# Patient Record
Sex: Female | Born: 1950 | Race: Black or African American | Hispanic: No | Marital: Married | Smoking: Never smoker
Health system: Southern US, Community
[De-identification: ages and names within clinical notes are randomized; demographics above are authoritative.]

## PROBLEM LIST (undated history)

## (undated) DIAGNOSIS — I10 Essential (primary) hypertension: Secondary | ICD-10-CM

## (undated) DIAGNOSIS — M810 Age-related osteoporosis without current pathological fracture: Secondary | ICD-10-CM

## (undated) DIAGNOSIS — K219 Gastro-esophageal reflux disease without esophagitis: Secondary | ICD-10-CM

## (undated) DIAGNOSIS — E119 Type 2 diabetes mellitus without complications: Secondary | ICD-10-CM

## (undated) DIAGNOSIS — C801 Malignant (primary) neoplasm, unspecified: Secondary | ICD-10-CM

## (undated) DIAGNOSIS — M1990 Unspecified osteoarthritis, unspecified site: Secondary | ICD-10-CM

## (undated) HISTORY — DX: Age-related osteoporosis without current pathological fracture: M81.0

## (undated) HISTORY — PX: GASTRIC BYPASS: SHX52

---

## 2007-07-03 DIAGNOSIS — C801 Malignant (primary) neoplasm, unspecified: Secondary | ICD-10-CM

## 2007-07-03 HISTORY — PX: NASAL SINUS SURGERY: SHX719

## 2007-07-03 HISTORY — DX: Malignant (primary) neoplasm, unspecified: C80.1

## 2008-06-24 ENCOUNTER — Ambulatory Visit: Payer: Self-pay | Admitting: Otolaryngology

## 2008-06-24 ENCOUNTER — Ambulatory Visit: Payer: Self-pay | Admitting: Internal Medicine

## 2008-07-01 ENCOUNTER — Ambulatory Visit: Payer: Self-pay | Admitting: Otolaryngology

## 2008-07-02 ENCOUNTER — Ambulatory Visit: Payer: Self-pay | Admitting: Internal Medicine

## 2008-07-15 ENCOUNTER — Ambulatory Visit: Payer: Self-pay | Admitting: Internal Medicine

## 2008-07-22 ENCOUNTER — Ambulatory Visit: Payer: Self-pay | Admitting: Otolaryngology

## 2008-08-02 ENCOUNTER — Ambulatory Visit: Payer: Self-pay | Admitting: Internal Medicine

## 2008-08-05 ENCOUNTER — Ambulatory Visit: Payer: Self-pay | Admitting: Radiation Oncology

## 2008-08-30 ENCOUNTER — Ambulatory Visit: Payer: Self-pay | Admitting: Internal Medicine

## 2008-09-30 ENCOUNTER — Ambulatory Visit: Payer: Self-pay | Admitting: Internal Medicine

## 2008-10-30 ENCOUNTER — Ambulatory Visit: Payer: Self-pay | Admitting: Internal Medicine

## 2009-03-21 ENCOUNTER — Ambulatory Visit: Payer: Self-pay | Admitting: Internal Medicine

## 2009-04-01 ENCOUNTER — Ambulatory Visit: Payer: Self-pay | Admitting: Internal Medicine

## 2009-04-14 ENCOUNTER — Ambulatory Visit: Payer: Self-pay | Admitting: Otolaryngology

## 2009-04-21 ENCOUNTER — Ambulatory Visit: Payer: Self-pay | Admitting: Otolaryngology

## 2009-07-02 ENCOUNTER — Ambulatory Visit: Payer: Self-pay | Admitting: Internal Medicine

## 2009-07-22 ENCOUNTER — Ambulatory Visit: Payer: Self-pay | Admitting: Internal Medicine

## 2009-08-02 ENCOUNTER — Ambulatory Visit: Payer: Self-pay | Admitting: Internal Medicine

## 2009-09-30 ENCOUNTER — Ambulatory Visit: Payer: Self-pay | Admitting: Internal Medicine

## 2009-10-19 ENCOUNTER — Ambulatory Visit: Payer: Self-pay | Admitting: Internal Medicine

## 2009-10-30 ENCOUNTER — Ambulatory Visit: Payer: Self-pay | Admitting: Internal Medicine

## 2009-12-30 ENCOUNTER — Ambulatory Visit: Payer: Self-pay | Admitting: Internal Medicine

## 2010-01-20 ENCOUNTER — Ambulatory Visit: Payer: Self-pay | Admitting: Internal Medicine

## 2010-01-30 ENCOUNTER — Ambulatory Visit: Payer: Self-pay | Admitting: Internal Medicine

## 2010-07-24 ENCOUNTER — Ambulatory Visit: Payer: Self-pay | Admitting: Internal Medicine

## 2010-08-02 ENCOUNTER — Ambulatory Visit: Payer: Self-pay | Admitting: Internal Medicine

## 2010-10-15 IMAGING — CR DG CHEST 1V PORT
1 series · 1 of 1 positions shown · non-contrast
Comparison: none

REASON FOR EXAM: chest pain
COMMENTS:

[view not recorded]
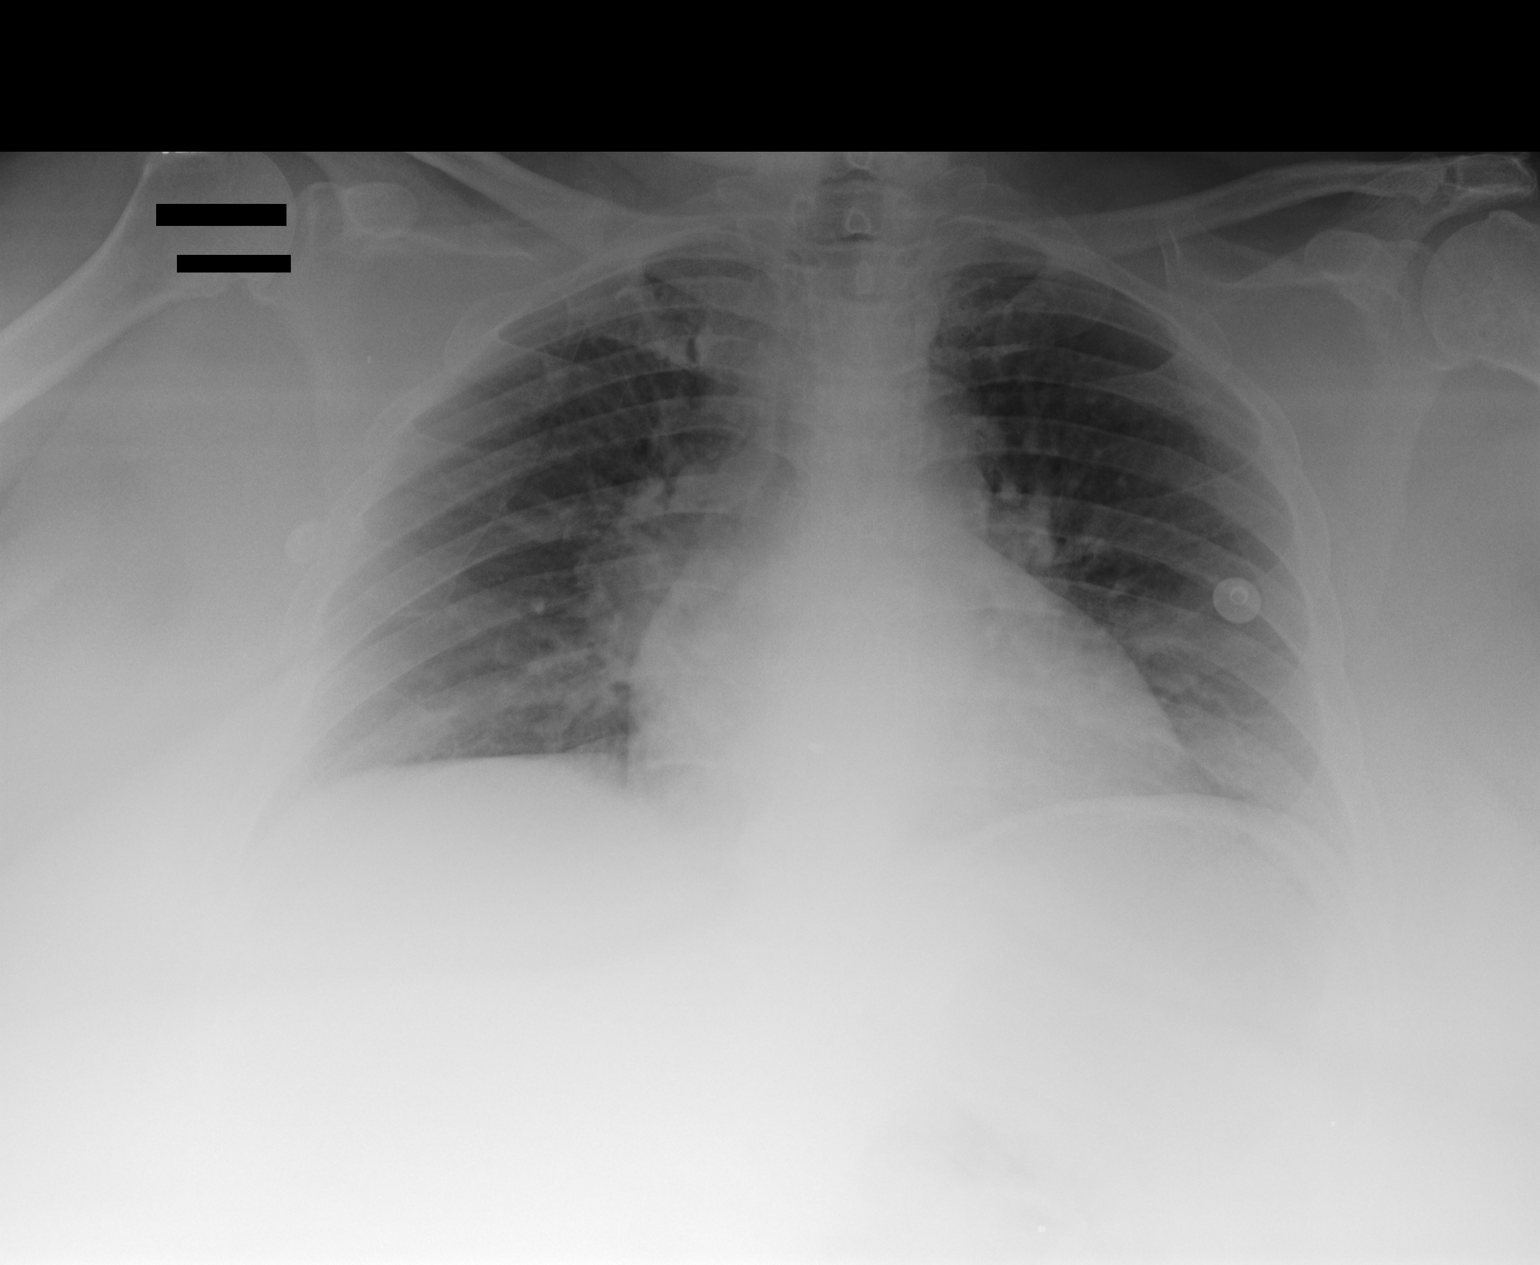

[1 of 1 positions shown; findings below may reference images not displayed]

PROCEDURE:     DXR - DXR PORTABLE CHEST SINGLE VIEW  - July 02, 2008  [DATE]

RESULT:      The patient has taken a shallow inspiration.  With technique
taken into consideration, there is no evidence of focal infiltrates,
effusions or edema. The cardiac silhouette is mildly enlarged. The
visualized bony skeleton is unremarkable.
IMPRESSION: Shallow inspiration without evidence of acute
cardiopulmonary disease.

## 2011-03-12 ENCOUNTER — Ambulatory Visit: Payer: Self-pay | Admitting: Internal Medicine

## 2011-04-02 ENCOUNTER — Ambulatory Visit: Payer: Self-pay | Admitting: Internal Medicine

## 2011-05-03 ENCOUNTER — Ambulatory Visit: Payer: Self-pay | Admitting: Internal Medicine

## 2016-01-09 LAB — VITAMIN B12: Vitamin B-12: >2000

## 2016-01-09 LAB — VITAMIN D 25 HYDROXY (VIT D DEFICIENCY, FRACTURES): Vit D, 25-Hydroxy: 52.4

## 2016-05-14 LAB — HM DEXA SCAN

## 2017-04-08 LAB — HM COLONOSCOPY

## 2017-06-03 ENCOUNTER — Other Ambulatory Visit: Payer: Self-pay

## 2017-06-12 NOTE — Discharge Instructions (Signed)

## 2017-06-17 ENCOUNTER — Encounter
Admission: RE | Disposition: A | Discharge status: Home / Self Care | Payer: Self-pay | Source: Ambulatory Visit | Attending: Ophthalmology

## 2017-06-17 ENCOUNTER — Ambulatory Visit
Admission: RE | Admit: 2017-06-17 | Discharge: 2017-06-17 | Disposition: A | Discharge status: Home / Self Care | Payer: BLUE CROSS/BLUE SHIELD | Source: Ambulatory Visit | Attending: Ophthalmology | Admitting: Ophthalmology

## 2017-06-17 ENCOUNTER — Ambulatory Visit: Payer: BLUE CROSS/BLUE SHIELD | Admitting: Anesthesiology

## 2017-06-17 DIAGNOSIS — M1990 Unspecified osteoarthritis, unspecified site: Secondary | ICD-10-CM | POA: Insufficient documentation

## 2017-06-17 DIAGNOSIS — K521 Toxic gastroenteritis and colitis: Secondary | ICD-10-CM | POA: Insufficient documentation

## 2017-06-17 DIAGNOSIS — Z6841 Body Mass Index (BMI) 40.0 and over, adult: Secondary | ICD-10-CM | POA: Insufficient documentation

## 2017-06-17 DIAGNOSIS — E1136 Type 2 diabetes mellitus with diabetic cataract: Secondary | ICD-10-CM | POA: Insufficient documentation

## 2017-06-17 HISTORY — PX: CATARACT EXTRACTION W/PHACO: SHX586

## 2017-06-17 HISTORY — DX: Malignant (primary) neoplasm, unspecified: C80.1

## 2017-06-17 HISTORY — DX: Gastro-esophageal reflux disease without esophagitis: K21.9

## 2017-06-17 HISTORY — DX: Type 2 diabetes mellitus without complications: E11.9

## 2017-06-17 HISTORY — DX: Essential (primary) hypertension: I10

## 2017-06-17 HISTORY — DX: Unspecified osteoarthritis, unspecified site: M19.90

## 2017-06-17 LAB — GLUCOSE, CAPILLARY
Glucose-Capillary: 128 mg/dL — ABNORMAL HIGH (ref 65–99)
Glucose-Capillary: 123 mg/dL — ABNORMAL HIGH (ref 65–99)

## 2017-06-17 SURGERY — PHACOEMULSIFICATION, CATARACT, WITH IOL INSERTION
Anesthesia: Monitor Anesthesia Care | Laterality: Left

## 2017-06-17 MED ORDER — LACTATED RINGERS IV SOLN: INTRAVENOUS | Status: DC

## 2017-06-17 MED ORDER — ACETAMINOPHEN 325 MG PO TABS
650.0000 mg | ORAL_TABLET | Freq: Four times a day (QID) | ORAL | Status: DC | PRN
  Administered 2017-06-17: 650 mg via ORAL

## 2017-06-17 MED ORDER — BRIMONIDINE TARTRATE-TIMOLOL 0.2-0.5 % OP SOLN
OPHTHALMIC | Status: DC | PRN
  Administered 2017-06-17: 1 [drp] via OPHTHALMIC

## 2017-06-17 MED ORDER — MIDAZOLAM HCL 2 MG/2ML IJ SOLN
INTRAMUSCULAR | Status: DC | PRN
  Administered 2017-06-17 (×2): 1 mg via INTRAVENOUS

## 2017-06-17 MED ORDER — LIDOCAINE HCL (PF) 2 % IJ SOLN
INTRAOCULAR | Status: DC | PRN
  Administered 2017-06-17: 1 mL via INTRAMUSCULAR

## 2017-06-17 MED ORDER — NA HYALUR & NA CHOND-NA HYALUR 0.4-0.35 ML IO KIT
PACK | INTRAOCULAR | Status: DC | PRN
  Administered 2017-06-17: 1 mL via INTRAOCULAR

## 2017-06-17 MED ORDER — CEFUROXIME OPHTHALMIC INJECTION 1 MG/0.1 ML
INJECTION | OPHTHALMIC | Status: DC | PRN
  Administered 2017-06-17: 0.1 mL via INTRACAMERAL

## 2017-06-17 MED ORDER — OXYCODONE HCL 5 MG PO TABS: 5.0000 mg | ORAL_TABLET | Freq: Once | ORAL | Status: DC | PRN

## 2017-06-17 MED ORDER — OXYCODONE HCL 5 MG/5ML PO SOLN: 5.0000 mg | Freq: Once | ORAL | Status: DC | PRN

## 2017-06-17 MED ORDER — FENTANYL CITRATE (PF) 100 MCG/2ML IJ SOLN
INTRAMUSCULAR | Status: DC | PRN
  Administered 2017-06-17: 50 ug via INTRAVENOUS

## 2017-06-17 MED ORDER — LACTATED RINGERS IV SOLN: 500.0000 mL | INTRAVENOUS | Status: DC

## 2017-06-17 MED ORDER — ARMC OPHTHALMIC DILATING DROPS
1.0000 "application " | OPHTHALMIC | Status: DC | PRN
  Administered 2017-06-17 (×3): 1 via OPHTHALMIC

## 2017-06-17 MED ORDER — MOXIFLOXACIN HCL 0.5 % OP SOLN
1.0000 [drp] | OPHTHALMIC | Status: DC | PRN
  Administered 2017-06-17 (×3): 1 [drp] via OPHTHALMIC

## 2017-06-17 SURGICAL SUPPLY — 25 items
CANNULA ANT/CHMB 27GA (MISCELLANEOUS) ×3 IMPLANT
CARTRIDGE ABBOTT (MISCELLANEOUS) IMPLANT
GLOVE SURG LX 7.5 STRW (GLOVE) ×2
GLOVE SURG LX STRL 7.5 STRW (GLOVE) ×1 IMPLANT
GLOVE SURG TRIUMPH 8.0 PF LTX (GLOVE) ×3 IMPLANT
GOWN STRL REUS W/ TWL LRG LVL3 (GOWN DISPOSABLE) ×2 IMPLANT
GOWN STRL REUS W/TWL LRG LVL3 (GOWN DISPOSABLE) ×4
LENS IOL TECNIS ITEC 20.5 (Intraocular Lens) ×3 IMPLANT
MARKER SKIN DUAL TIP RULER LAB (MISCELLANEOUS) ×3 IMPLANT
NDL RETROBULBAR .5 NSTRL (NEEDLE) IMPLANT
NEEDLE FILTER BLUNT 18X 1/2SAF (NEEDLE) ×2
NEEDLE FILTER BLUNT 18X1 1/2 (NEEDLE) ×1 IMPLANT
PACK CATARACT BRASINGTON (MISCELLANEOUS) ×3 IMPLANT
PACK EYE AFTER SURG (MISCELLANEOUS) ×3 IMPLANT
PACK OPTHALMIC (MISCELLANEOUS) ×3 IMPLANT
RING MALYGIN 7.0 (MISCELLANEOUS) IMPLANT
SUT ETHILON 10-0 CS-B-6CS-B-6 (SUTURE)
SUT VICRYL  9 0 (SUTURE)
SUT VICRYL 9 0 (SUTURE) IMPLANT
SUTURE EHLN 10-0 CS-B-6CS-B-6 (SUTURE) IMPLANT
SYR 3ML LL SCALE MARK (SYRINGE) ×3 IMPLANT
SYR 5ML LL (SYRINGE) ×3 IMPLANT
SYR TB 1ML LUER SLIP (SYRINGE) ×3 IMPLANT
WATER STERILE IRR 250ML POUR (IV SOLUTION) ×3 IMPLANT
WIPE NON LINTING 3.25X3.25 (MISCELLANEOUS) ×3 IMPLANT

## 2017-06-17 NOTE — H&P (Signed)
The History and Physical notes are on paper, have been signed, and are to be scanned. The patient remains stable and unchanged from the H&P.   Previous H&P reviewed, patient examined, and there are no changes.  Tina Carey 06/17/2017 8:49 AM

## 2017-06-17 NOTE — Anesthesia Preprocedure Evaluation (Signed)
Anesthesia Evaluation  Patient identified by MRN, date of birth, ID band  Reviewed: NPO status   History of Anesthesia Complications Negative for: history of anesthetic complications  Airway Mallampati: II  TM Distance: >3 FB Neck ROM: full    Dental no notable dental hx.    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    Anesthesia Other Findings   Reproductive/Obstetrics                             Anesthesia Physical Anesthesia Plan  ASA: III  Anesthesia Plan: MAC   Post-op Pain Management:    Induction:   PONV Risk Score and Plan:   Airway Management Planned:   Additional Equipment:   Intra-op Plan:   Post-operative Plan:   Informed Consent: I have reviewed the patients History and Physical, chart, labs and discussed the procedure including the risks, benefits and alternatives for the proposed anesthesia with the patient or authorized representative who has indicated his/her understanding and acceptance.     Plan Discussed with: CRNA  Anesthesia Plan Comments:         Anesthesia Quick Evaluation

## 2017-06-17 NOTE — Anesthesia Postprocedure Evaluation (Signed)
Anesthesia Post Note  Patient: Tina Carey  Procedure(s) Performed: CATARACT EXTRACTION PHACO AND INTRAOCULAR LENS PLACEMENT (IOC)  LEFT DIABETIC (Left )  Patient location during evaluation: PACU Anesthesia Type: MAC Level of consciousness: awake and alert Pain management: pain level controlled Vital Signs Assessment: post-procedure vital signs reviewed and stable Respiratory status: spontaneous breathing, nonlabored ventilation, respiratory function stable and patient connected to nasal cannula oxygen Cardiovascular status: stable and blood pressure returned to baseline Postop Assessment: no apparent nausea or vomiting Anesthetic complications: no    Tahjay Binion

## 2017-06-17 NOTE — Anesthesia Procedure Notes (Signed)
Procedure Name: MAC Date/Time: 06/17/2017 9:25 AM Performed by: Cameron Ali, CRNA Pre-anesthesia Checklist: Patient identified, Emergency Drugs available, Suction available, Timeout performed and Patient being monitored Patient Re-evaluated:Patient Re-evaluated prior to induction Oxygen Delivery Method: Nasal cannula Placement Confirmation: positive ETCO2

## 2017-06-17 NOTE — Op Note (Signed)
OPERATIVE NOTE  Tina Carey [id number] 06/17/2017   PREOPERATIVE DIAGNOSIS:  Nuclear sclerotic cataract left eye. H25.12   POSTOPERATIVE DIAGNOSIS:    Nuclear sclerotic cataract left eye.     PROCEDURE:  Phacoemusification with posterior chamber intraocular lens placement of the left eye   LENS:   Implant Name Type Inv. Item Serial No. Manufacturer Lot No. LRB No. Used  LENS IOL DIOP 20.5 - [id number] Intraocular Lens LENS IOL DIOP 20.5 [id number] AMO  Left 1        ULTRASOUND TIME: 14  % of 1 minutes 6 seconds, CDE 9.0  SURGEON:  Wyonia Hough, MD   ANESTHESIA:  Topical with tetracaine drops and 2% Xylocaine jelly, augmented with 1% preservative-free intracameral lidocaine.    COMPLICATIONS:  None.   DESCRIPTION OF PROCEDURE:  The patient was identified in the holding room and transported to the operating room and placed in the supine position under the operating microscope.  The left eye was identified as the operative eye and it was prepped and draped in the usual sterile ophthalmic fashion.   A 1 millimeter clear-corneal paracentesis was made at the 1:30 position.  0.5 ml of preservative-free 1% lidocaine was injected into the anterior chamber.  The anterior chamber was filled with Viscoat viscoelastic.  A 2.4 millimeter keratome was used to make a near-clear corneal incision at the 10:30 position.  .  A curvilinear capsulorrhexis was made with a cystotome and capsulorrhexis forceps.  Balanced salt solution was used to hydrodissect and hydrodelineate the nucleus.   Phacoemulsification was then used in stop and chop fashion to remove the lens nucleus and epinucleus.  The remaining cortex was then removed using the irrigation and aspiration handpiece. Provisc was then placed into the capsular bag to distend it for lens placement.  A lens was then injected into the capsular bag.  The remaining viscoelastic was aspirated.   Wounds were hydrated with balanced salt  solution.  The anterior chamber was inflated to a physiologic pressure with balanced salt solution.  No wound leaks were noted. Cefuroxime 0.1 ml of a 10mg /ml solution was injected into the anterior chamber for a dose of 1 mg of intracameral antibiotic at the completion of the case.   Timolol and Brimonidine drops were applied to the eye.  The patient was taken to the recovery room in stable condition without complications of anesthesia or surgery.  Tina Carey 06/17/2017, 9:43 AM

## 2017-06-17 NOTE — Transfer of Care (Signed)
Immediate Anesthesia Transfer of Care Note  Patient: Tina Carey  Procedure(s) Performed: CATARACT EXTRACTION PHACO AND INTRAOCULAR LENS PLACEMENT (IOC)  LEFT DIABETIC (Left )  Patient Location: PACU  Anesthesia Type: MAC  Level of Consciousness: awake, alert  and patient cooperative  Airway and Oxygen Therapy: Patient Spontanous Breathing and Patient connected to supplemental oxygen  Post-op Assessment: Post-op Vital signs reviewed, Patient's Cardiovascular Status Stable, Respiratory Function Stable, Patent Airway and No signs of Nausea or vomiting  Post-op Vital Signs: Reviewed and stable  Complications: No apparent anesthesia complications

## 2017-06-18 ENCOUNTER — Encounter: Payer: Self-pay | Admitting: Ophthalmology

## 2017-06-18 MED ORDER — EPINEPHRINE PF 1 MG/ML IJ SOLN
INTRAMUSCULAR | Status: DC | PRN
  Administered 2017-06-18: 70 mL via OPHTHALMIC

## 2017-10-16 ENCOUNTER — Encounter: Payer: Self-pay | Admitting: *Deleted

## 2017-10-16 ENCOUNTER — Other Ambulatory Visit: Payer: Self-pay

## 2017-10-21 NOTE — Discharge Instructions (Signed)

## 2017-10-23 ENCOUNTER — Ambulatory Visit
Admission: RE | Admit: 2017-10-23 | Discharge: 2017-10-23 | Disposition: A | Discharge status: Home / Self Care | Payer: Medicare Other | Source: Ambulatory Visit | Attending: Ophthalmology | Admitting: Ophthalmology

## 2017-10-23 ENCOUNTER — Encounter
Admission: RE | Disposition: A | Discharge status: Home / Self Care | Payer: Self-pay | Source: Ambulatory Visit | Attending: Ophthalmology

## 2017-10-23 ENCOUNTER — Ambulatory Visit: Payer: Medicare Other | Admitting: Anesthesiology

## 2017-10-23 DIAGNOSIS — Z7984 Long term (current) use of oral hypoglycemic drugs: Secondary | ICD-10-CM | POA: Diagnosis not present

## 2017-10-23 DIAGNOSIS — Z923 Personal history of irradiation: Secondary | ICD-10-CM | POA: Diagnosis not present

## 2017-10-23 DIAGNOSIS — Z6841 Body Mass Index (BMI) 40.0 and over, adult: Secondary | ICD-10-CM | POA: Insufficient documentation

## 2017-10-23 DIAGNOSIS — Z79899 Other long term (current) drug therapy: Secondary | ICD-10-CM | POA: Insufficient documentation

## 2017-10-23 DIAGNOSIS — K219 Gastro-esophageal reflux disease without esophagitis: Secondary | ICD-10-CM | POA: Diagnosis not present

## 2017-10-23 DIAGNOSIS — Z9221 Personal history of antineoplastic chemotherapy: Secondary | ICD-10-CM | POA: Insufficient documentation

## 2017-10-23 DIAGNOSIS — Z9842 Cataract extraction status, left eye: Secondary | ICD-10-CM | POA: Insufficient documentation

## 2017-10-23 DIAGNOSIS — Z9884 Bariatric surgery status: Secondary | ICD-10-CM | POA: Insufficient documentation

## 2017-10-23 DIAGNOSIS — H2511 Age-related nuclear cataract, right eye: Secondary | ICD-10-CM | POA: Insufficient documentation

## 2017-10-23 DIAGNOSIS — E1136 Type 2 diabetes mellitus with diabetic cataract: Secondary | ICD-10-CM | POA: Insufficient documentation

## 2017-10-23 DIAGNOSIS — Z87891 Personal history of nicotine dependence: Secondary | ICD-10-CM | POA: Insufficient documentation

## 2017-10-23 DIAGNOSIS — I10 Essential (primary) hypertension: Secondary | ICD-10-CM | POA: Diagnosis not present

## 2017-10-23 DIAGNOSIS — Z8522 Personal history of malignant neoplasm of nasal cavities, middle ear, and accessory sinuses: Secondary | ICD-10-CM | POA: Insufficient documentation

## 2017-10-23 HISTORY — PX: CATARACT EXTRACTION W/PHACO: SHX586

## 2017-10-23 LAB — GLUCOSE, CAPILLARY
GLUCOSE-CAPILLARY: 107 mg/dL — AB (ref 65–99)
Glucose-Capillary: 123 mg/dL — ABNORMAL HIGH (ref 65–99)

## 2017-10-23 SURGERY — PHACOEMULSIFICATION, CATARACT, WITH IOL INSERTION
Anesthesia: Monitor Anesthesia Care | Site: Eye | Laterality: Right | Wound class: "Clean "

## 2017-10-23 MED ORDER — MIDAZOLAM HCL 2 MG/2ML IJ SOLN
INTRAMUSCULAR | Status: DC | PRN
  Administered 2017-10-23: 1 mg via INTRAVENOUS

## 2017-10-23 MED ORDER — NA HYALUR & NA CHOND-NA HYALUR 0.4-0.35 ML IO KIT
PACK | INTRAOCULAR | Status: DC | PRN
  Administered 2017-10-23: 1 mL via INTRAOCULAR

## 2017-10-23 MED ORDER — ACETAMINOPHEN 325 MG PO TABS: 325.0000 mg | ORAL_TABLET | Freq: Once | ORAL | Status: DC

## 2017-10-23 MED ORDER — ARMC OPHTHALMIC DILATING DROPS
1.0000 "application " | OPHTHALMIC | Status: DC | PRN
  Administered 2017-10-23 (×3): 1 via OPHTHALMIC

## 2017-10-23 MED ORDER — FENTANYL CITRATE (PF) 100 MCG/2ML IJ SOLN
INTRAMUSCULAR | Status: DC | PRN
  Administered 2017-10-23: 50 ug via INTRAVENOUS

## 2017-10-23 MED ORDER — BRIMONIDINE TARTRATE-TIMOLOL 0.2-0.5 % OP SOLN
OPHTHALMIC | Status: DC | PRN
  Administered 2017-10-23: 1 [drp] via OPHTHALMIC

## 2017-10-23 MED ORDER — EPINEPHRINE PF 1 MG/ML IJ SOLN
INTRAOCULAR | Status: DC | PRN
  Administered 2017-10-23: 49 mL via OPHTHALMIC

## 2017-10-23 MED ORDER — ACETAMINOPHEN 160 MG/5ML PO SOLN: 325.0000 mg | Freq: Once | ORAL | Status: DC

## 2017-10-23 MED ORDER — MOXIFLOXACIN HCL 0.5 % OP SOLN
1.0000 [drp] | OPHTHALMIC | Status: DC | PRN
  Administered 2017-10-23 (×3): 1 [drp] via OPHTHALMIC

## 2017-10-23 MED ORDER — LACTATED RINGERS IV SOLN: 10.0000 mL/h | INTRAVENOUS | Status: DC

## 2017-10-23 MED ORDER — LIDOCAINE HCL (PF) 2 % IJ SOLN
INTRAOCULAR | Status: DC | PRN
  Administered 2017-10-23: .5 mL

## 2017-10-23 MED ORDER — CEFUROXIME OPHTHALMIC INJECTION 1 MG/0.1 ML
INJECTION | OPHTHALMIC | Status: DC | PRN
  Administered 2017-10-23: 0.1 mL via INTRACAMERAL

## 2017-10-23 SURGICAL SUPPLY — 27 items
CANNULA ANT/CHMB 27G (MISCELLANEOUS) ×1 IMPLANT
CANNULA ANT/CHMB 27GA (MISCELLANEOUS) ×3 IMPLANT
CARTRIDGE ABBOTT (MISCELLANEOUS) IMPLANT
GLOVE SURG LX 7.5 STRW (GLOVE) ×2
GLOVE SURG LX STRL 7.5 STRW (GLOVE) ×1 IMPLANT
GLOVE SURG TRIUMPH 8.0 PF LTX (GLOVE) ×3 IMPLANT
GOWN STRL REUS W/ TWL LRG LVL3 (GOWN DISPOSABLE) ×2 IMPLANT
GOWN STRL REUS W/TWL LRG LVL3 (GOWN DISPOSABLE) ×4
LENS IOL TECNIS ITEC 20.5 (Intraocular Lens) ×2 IMPLANT
MARKER SKIN DUAL TIP RULER LAB (MISCELLANEOUS) ×3 IMPLANT
NDL FILTER BLUNT 18X1 1/2 (NEEDLE) ×1 IMPLANT
NDL RETROBULBAR .5 NSTRL (NEEDLE) IMPLANT
NEEDLE FILTER BLUNT 18X 1/2SAF (NEEDLE) ×2
NEEDLE FILTER BLUNT 18X1 1/2 (NEEDLE) ×1 IMPLANT
PACK CATARACT BRASINGTON (MISCELLANEOUS) ×3 IMPLANT
PACK EYE AFTER SURG (MISCELLANEOUS) ×3 IMPLANT
PACK OPTHALMIC (MISCELLANEOUS) ×3 IMPLANT
RING MALYGIN 7.0 (MISCELLANEOUS) IMPLANT
SUT ETHILON 10-0 CS-B-6CS-B-6 (SUTURE)
SUT VICRYL  9 0 (SUTURE)
SUT VICRYL 9 0 (SUTURE) IMPLANT
SUTURE EHLN 10-0 CS-B-6CS-B-6 (SUTURE) IMPLANT
SYR 3ML LL SCALE MARK (SYRINGE) ×3 IMPLANT
SYR 5ML LL (SYRINGE) ×3 IMPLANT
SYR TB 1ML LUER SLIP (SYRINGE) ×3 IMPLANT
WATER STERILE IRR 500ML POUR (IV SOLUTION) ×3 IMPLANT
WIPE NON LINTING 3.25X3.25 (MISCELLANEOUS) ×3 IMPLANT

## 2017-10-23 NOTE — Op Note (Signed)
LOCATION:  [place]   PREOPERATIVE DIAGNOSIS:    Nuclear sclerotic cataract right eye. H25.11   POSTOPERATIVE DIAGNOSIS:  Nuclear sclerotic cataract right eye.     PROCEDURE:  Phacoemusification with posterior chamber intraocular lens placement of the right eye   LENS:   Implant Name Type Inv. Item Serial No. Manufacturer Lot No. LRB No. Used  LENS IOL DIOP 20.5 - [id number] Intraocular Lens LENS IOL DIOP 20.5 [id number] AMO  Right 1        ULTRASOUND TIME: 13 % of 0 minutes, 44 seconds.  CDE 5.78   SURGEON:  Wyonia Hough, MD   ANESTHESIA:  Topical with tetracaine drops and 2% Xylocaine jelly, augmented with 1% preservative-free intracameral lidocaine.    COMPLICATIONS:  None.   DESCRIPTION OF PROCEDURE:  The patient was identified in the holding room and transported to the operating room and placed in the supine position under the operating microscope.  The right eye was identified as the operative eye and it was prepped and draped in the usual sterile ophthalmic fashion.   A 1 millimeter clear-corneal paracentesis was made at the 12:00 position.  0.5 ml of preservative-free 1% lidocaine was injected into the anterior chamber. The anterior chamber was filled with Viscoat viscoelastic.  A 2.4 millimeter keratome was used to make a near-clear corneal incision at the 9:00 position.  A curvilinear capsulorrhexis was made with a cystotome and capsulorrhexis forceps.  Balanced salt solution was used to hydrodissect and hydrodelineate the nucleus.   Phacoemulsification was then used in stop and chop fashion to remove the lens nucleus and epinucleus.  The remaining cortex was then removed using the irrigation and aspiration handpiece. Provisc was then placed into the capsular bag to distend it for lens placement.  A lens was then injected into the capsular bag.  The remaining viscoelastic was aspirated.   Wounds were hydrated with balanced salt solution.  The anterior  chamber was inflated to a physiologic pressure with balanced salt solution.  No wound leaks were noted. Cefuroxime 0.1 ml of a 10mg /ml solution was injected into the anterior chamber for a dose of 1 mg of intracameral antibiotic at the completion of the case.   Timolol and Brimonidine drops were applied to the eye.  The patient was taken to the recovery room in stable condition without complications of anesthesia or surgery.   Tina Carey 10/23/2017, 7:59 AM

## 2017-10-23 NOTE — Anesthesia Procedure Notes (Signed)
Procedure Name: MAC Performed by: Lind Guest, CRNA Pre-anesthesia Checklist: Patient identified, Emergency Drugs available, Suction available, Patient being monitored and Timeout performed Patient Re-evaluated:Patient Re-evaluated prior to induction Oxygen Delivery Method: Nasal cannula

## 2017-10-23 NOTE — Anesthesia Postprocedure Evaluation (Signed)
Anesthesia Post Note  Patient: Tina Carey  Procedure(s) Performed: CATARACT EXTRACTION PHACO AND INTRAOCULAR LENS PLACEMENT (IOC) RIGHT DIABETIC (Right Eye)  Patient location during evaluation: PACU Anesthesia Type: MAC Level of consciousness: awake and alert and oriented Pain management: satisfactory to patient Vital Signs Assessment: post-procedure vital signs reviewed and stable Respiratory status: spontaneous breathing, nonlabored ventilation and respiratory function stable Cardiovascular status: blood pressure returned to baseline and stable Postop Assessment: Adequate PO intake and No signs of nausea or vomiting Anesthetic complications: no    Raliegh Ip

## 2017-10-23 NOTE — H&P (Signed)
The History and Physical notes are on paper, have been signed, and are to be scanned. The patient remains stable and unchanged from the H&P.   Previous H&P reviewed, patient examined, and there are no changes.  Tina Carey 10/23/2017 7:37 AM

## 2017-10-23 NOTE — Transfer of Care (Signed)
Immediate Anesthesia Transfer of Care Note  Patient: Tina Carey  Procedure(s) Performed: CATARACT EXTRACTION PHACO AND INTRAOCULAR LENS PLACEMENT (IOC) RIGHT DIABETIC (Right Eye)  Patient Location: PACU  Anesthesia Type: MAC  Level of Consciousness: awake, alert  and patient cooperative  Airway and Oxygen Therapy: Patient Spontanous Breathing and Patient connected to supplemental oxygen  Post-op Assessment: Post-op Vital signs reviewed, Patient's Cardiovascular Status Stable, Respiratory Function Stable, Patent Airway and No signs of Nausea or vomiting  Post-op Vital Signs: Reviewed and stable  Complications: No apparent anesthesia complications

## 2017-10-23 NOTE — Anesthesia Preprocedure Evaluation (Signed)
Anesthesia Evaluation  Patient identified by MRN, date of birth, ID band  Reviewed: NPO status   History of Anesthesia Complications Negative for: history of anesthetic complications  Airway Mallampati: II  TM Distance: >3 FB Neck ROM: full    Dental no notable dental hx.    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    Anesthesia Other Findings   Reproductive/Obstetrics                             Anesthesia Physical  Anesthesia Plan  ASA: III  Anesthesia Plan: MAC   Post-op Pain Management:    Induction:   PONV Risk Score and Plan: 2 and Treatment may vary due to age or medical condition and Ondansetron  Airway Management Planned:   Additional Equipment:   Intra-op Plan:   Post-operative Plan:   Informed Consent: I have reviewed the patients History and Physical, chart, labs and discussed the procedure including the risks, benefits and alternatives for the proposed anesthesia with the patient or authorized representative who has indicated his/her understanding and acceptance.     Plan Discussed with: CRNA  Anesthesia Plan Comments:         Anesthesia Quick Evaluation

## 2017-10-24 ENCOUNTER — Encounter: Payer: Self-pay | Admitting: Ophthalmology

## 2018-03-21 LAB — BASIC METABOLIC PANEL
BUN: 21 (ref 4–21)
CO2: 24 — AB (ref 13–22)
Chloride: 99 (ref 99–108)
Creatinine: 0.9 (ref 0.5–1.1)
Glucose: 117
Potassium: 4.5 (ref 3.4–5.3)
Sodium: 141 (ref 137–147)

## 2018-03-21 LAB — COMPREHENSIVE METABOLIC PANEL
Albumin: 4.6 (ref 3.5–5.0)
Calcium: 10.1 (ref 8.7–10.7)

## 2018-03-21 LAB — HEPATIC FUNCTION PANEL
ALT: 14 (ref 7–35)
AST: 18 (ref 13–35)
Alkaline Phosphatase: 73 (ref 25–125)
Bilirubin, Direct: 0.07 (ref 0.01–0.4)
Bilirubin, Total: 0.2

## 2018-03-21 LAB — CBC AND DIFFERENTIAL
HCT: 39 (ref 36–46)
Hemoglobin: 12.6 (ref 12.0–16.0)
Neutrophils Absolute: 6
Platelets: 428 — AB (ref 150–399)
WBC: 9.8

## 2018-03-21 LAB — CBC: RBC: 4.33 (ref 3.87–5.11)

## 2018-03-21 LAB — TSH: TSH: 0.84 (ref 0.41–5.90)

## 2018-03-21 LAB — LIPID PANEL
Cholesterol: 192 (ref 0–200)
HDL: 59 (ref 35–70)
LDL Cholesterol: 105
Triglycerides: 140 (ref 40–160)

## 2018-03-21 LAB — HEMOGLOBIN A1C: Hemoglobin A1C: 7.3

## 2018-07-08 LAB — BASIC METABOLIC PANEL
BUN: 17 (ref 4–21)
CO2: 22 (ref 13–22)
Chloride: 102 (ref 99–108)
Creatinine: 0.9 (ref 0.5–1.1)
Glucose: 173
Potassium: 5.1 (ref 3.4–5.3)
Sodium: 140 (ref 137–147)

## 2018-07-08 LAB — LIPID PANEL
Cholesterol: 199 (ref 0–200)
HDL: 63 (ref 35–70)
LDL Cholesterol: 113
Triglycerides: 116 (ref 40–160)

## 2018-07-08 LAB — HEMOGLOBIN A1C: Hemoglobin A1C: 7.9

## 2018-07-08 LAB — COMPREHENSIVE METABOLIC PANEL: Calcium: 9.9 (ref 8.7–10.7)

## 2018-09-19 LAB — COMPREHENSIVE METABOLIC PANEL
Albumin: 4.6 (ref 3.5–5.0)
Calcium: 10.1 (ref 8.7–10.7)

## 2018-09-19 LAB — HEMOGLOBIN A1C: Hemoglobin A1C: 6.5

## 2018-09-19 LAB — BASIC METABOLIC PANEL
BUN: 20 (ref 4–21)
CO2: 26 — AB (ref 13–22)
Chloride: 100 (ref 99–108)
Creatinine: 0.9 (ref 0.5–1.1)
Glucose: 95
Potassium: 4.8 (ref 3.4–5.3)
Sodium: 141 (ref 137–147)

## 2018-09-19 LAB — HEPATIC FUNCTION PANEL
ALT: 15 (ref 7–35)
AST: 19 (ref 13–35)
Bilirubin, Total: 0.3

## 2018-09-19 LAB — LIPID PANEL
Cholesterol: 182 (ref 0–200)
HDL: 61 (ref 35–70)
LDL Cholesterol: 104
Triglycerides: 83 (ref 40–160)

## 2019-05-20 LAB — HM DEXA SCAN

## 2019-05-20 LAB — HM MAMMOGRAPHY

## 2019-08-14 ENCOUNTER — Other Ambulatory Visit: Payer: Self-pay

## 2019-08-14 ENCOUNTER — Ambulatory Visit: Payer: Medicare HMO | Attending: Internal Medicine

## 2019-08-14 DIAGNOSIS — Z23 Encounter for immunization: Secondary | ICD-10-CM

## 2019-08-14 NOTE — Progress Notes (Signed)
   Covid-19 Vaccination Clinic  Name:  Tina Carey    MRN: [id number] DOB: [birth year]  08/14/2019  [name] was observed post Covid-19 immunization for 15 minutes without incidence. She was provided with Vaccine Information Sheet and instruction to access the V-Safe system.   [name] was instructed to call 911 with any severe reactions post vaccine: Marland Kitchen Difficulty breathing  . Swelling of your face and throat  . A fast heartbeat  . A bad rash all over your body  . Dizziness and weakness    Immunizations Administered    Name Date Dose VIS Date Route   Moderna COVID-19 Vaccine 08/14/2019 12:17 PM 0.5 mL 06/02/2019 Intramuscular   Manufacturer: Moderna   Lot: [id number]   [id number]

## 2019-08-24 DIAGNOSIS — M170 Bilateral primary osteoarthritis of knee: Secondary | ICD-10-CM | POA: Insufficient documentation

## 2019-09-14 ENCOUNTER — Ambulatory Visit: Payer: Medicare HMO | Attending: Internal Medicine

## 2019-09-14 DIAGNOSIS — Z23 Encounter for immunization: Secondary | ICD-10-CM

## 2019-09-14 NOTE — Progress Notes (Signed)
   Covid-19 Vaccination Clinic  Name:  Tina Carey    MRN: [id number] DOB: 07/09/1950  09/14/2019  Tina Carey was observed post Covid-19 immunization for 15 minutes without incident. She was provided with Vaccine Information Sheet and instruction to access the V-Safe system.   Tina Carey was instructed to call 911 with any severe reactions post vaccine: Marland Kitchen Difficulty breathing  . Swelling of face and throat  . A fast heartbeat  . A bad rash all over body  . Dizziness and weakness   Immunizations Administered    Name Date Dose VIS Date Route   Moderna COVID-19 Vaccine 09/14/2019 12:04 PM 0.5 mL 06/02/2019 Intramuscular   Manufacturer: Moderna   Lot: [id number]   [id number]

## 2020-02-02 ENCOUNTER — Ambulatory Visit: Payer: Self-pay | Admitting: Family Medicine

## 2020-02-10 ENCOUNTER — Encounter: Payer: Self-pay | Admitting: Family Medicine

## 2020-02-10 ENCOUNTER — Other Ambulatory Visit: Payer: Self-pay

## 2020-02-10 ENCOUNTER — Ambulatory Visit (INDEPENDENT_AMBULATORY_CARE_PROVIDER_SITE_OTHER): Payer: Medicare HMO | Admitting: Family Medicine

## 2020-02-10 VITALS — BP 132/46 | HR 78 | Temp 97.5°F | Resp 16 | Ht 60.0 in | Wt 252.8 lb

## 2020-02-10 DIAGNOSIS — K219 Gastro-esophageal reflux disease without esophagitis: Secondary | ICD-10-CM

## 2020-02-10 DIAGNOSIS — E1169 Type 2 diabetes mellitus with other specified complication: Secondary | ICD-10-CM | POA: Insufficient documentation

## 2020-02-10 DIAGNOSIS — Z8522 Personal history of malignant neoplasm of nasal cavities, middle ear, and accessory sinuses: Secondary | ICD-10-CM

## 2020-02-10 DIAGNOSIS — Z7689 Persons encountering health services in other specified circumstances: Secondary | ICD-10-CM | POA: Diagnosis not present

## 2020-02-10 DIAGNOSIS — E785 Hyperlipidemia, unspecified: Secondary | ICD-10-CM | POA: Insufficient documentation

## 2020-02-10 DIAGNOSIS — Z6841 Body Mass Index (BMI) 40.0 and over, adult: Secondary | ICD-10-CM | POA: Insufficient documentation

## 2020-02-10 MED ORDER — PANTOPRAZOLE SODIUM 40 MG PO TBEC: 40.0000 mg | DELAYED_RELEASE_TABLET | Freq: Every day | ORAL | 3 refills | Status: DC

## 2020-02-10 MED ORDER — GLIMEPIRIDE 2 MG PO TABS: 2.0000 mg | ORAL_TABLET | Freq: Two times a day (BID) | ORAL | 3 refills | Status: DC

## 2020-02-10 MED ORDER — METFORMIN HCL ER 500 MG PO TB24: ORAL_TABLET | ORAL | 3 refills | Status: DC

## 2020-02-10 MED ORDER — LISINOPRIL 10 MG PO TABS: 10.0000 mg | ORAL_TABLET | Freq: Every day | ORAL | 3 refills | Status: DC

## 2020-02-10 NOTE — Patient Instructions (Addendum)
Thank you for coming to the office today.  Refilled all meds to [facility] for 90 day with plenty of refills.  Call insurance find cost and coverage of the following   Rybelsus (pill - oral semaglutide or ozempic) - once daily, taken first thing in morning without other meds Ozempic (injection once week Trulicity (injection once week  We can get these newer meds at low cost if you are interested.  3 benefits - 1 significantly reduced A1c sugar, and may be able to reduce or stop metformin in future - 2 reduced appetite and weight loss with good results - 3 cardiovascular risk reduction, less likely to have heart attack/stroke   Next appointment fasting, in AM, will decide what blood to draw AFTER your appointment.  We will order the Mammogram / DEXA bone scan next time.  Please schedule a Follow-up Appointment to: Return in about 3 months (around 05/12/2020) for 3 month Diabetes, fasting lab AFTER apt..  If you have any other questions or concerns, please feel free to call the office or send a message through [place]. You may also schedule an earlier appointment if necessary.  Additionally, you may be receiving a survey about your experience at our office within a few days to 1 week by e-mail or mail. We value your feedback.  Nobie Putnam, DO [place]
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
## 2020-02-11 ENCOUNTER — Encounter: Payer: Self-pay | Admitting: Family Medicine

## 2020-02-11 DIAGNOSIS — Z8522 Personal history of malignant neoplasm of nasal cavities, middle ear, and accessory sinuses: Secondary | ICD-10-CM | POA: Insufficient documentation

## 2020-02-11 NOTE — Assessment & Plan Note (Signed)
Followed by Dr Delice Lesch ENT No recurrence, >10 years S/p surgical execision + chemoradiation

## 2020-02-11 NOTE — Assessment & Plan Note (Signed)
Encourage diet exercise lifestyle goals Future consider GLP1 therapy for wt loss

## 2020-02-11 NOTE — Assessment & Plan Note (Signed)
Previously reported Well-controlled DM with A1c approximately 6-7 Complications - hyperlipidemia, GERD,  morbid obesity, - increases risk of future cardiovascular complications   Plan:  1. Continue current therapy - Metformin XR 500mg  x 1 breakfast / 1 lunch / 2 supper, and Glimepiride 2mg  BID wc - Discussion today on counseling about benefits and advantage of newer diabetic therapy with SGLT2 and GLP1 therapy, she declines injections. She is interested in Rybelsus, she will check into this before next apt and see lab results 2. Encourage improved lifestyle - low carb, low sugar diet, reduce portion size, continue improving regular exercise 3. Check CBG , bring log to next visit for review 4. Continue ACEi 5. DM Foot exam done today / Advised to schedule DM ophtho exam, send record

## 2020-02-11 NOTE — Assessment & Plan Note (Signed)
Previously reported controlled cholesterol on lifestyle Last lipid panel uncertain 3-46 mo ago Elevated ASCVD based on age + Diabetes  Plan: 1. Offered statin therapy. She declines today due to concerns regarding side effects despite counseling on benefits. 2. Encourage improved lifestyle - low carb/cholesterol, reduce portion size, continue improving regular exercise 3. Follow-up w/ review of records, anticipate labs in 3 months regardless

## 2020-03-01 LAB — HM DIABETES EYE EXAM

## 2020-03-03 ENCOUNTER — Encounter: Payer: Self-pay | Admitting: Family Medicine

## 2020-03-04 ENCOUNTER — Encounter: Payer: Self-pay | Admitting: Family Medicine

## 2020-03-04 DIAGNOSIS — E113299 Type 2 diabetes mellitus with mild nonproliferative diabetic retinopathy without macular edema, unspecified eye: Secondary | ICD-10-CM | POA: Insufficient documentation

## 2020-03-10 ENCOUNTER — Encounter: Payer: Self-pay | Admitting: Family Medicine

## 2020-03-15 ENCOUNTER — Encounter: Payer: Self-pay | Admitting: Family Medicine

## 2020-05-17 ENCOUNTER — Other Ambulatory Visit: Payer: Self-pay

## 2020-05-17 ENCOUNTER — Ambulatory Visit (INDEPENDENT_AMBULATORY_CARE_PROVIDER_SITE_OTHER): Payer: Medicare HMO | Admitting: Family Medicine

## 2020-05-17 ENCOUNTER — Encounter: Payer: Self-pay | Admitting: Family Medicine

## 2020-05-17 VITALS — BP 138/49 | HR 78 | Temp 97.7°F | Resp 16 | Ht 60.0 in | Wt 252.0 lb

## 2020-05-17 DIAGNOSIS — E113299 Type 2 diabetes mellitus with mild nonproliferative diabetic retinopathy without macular edema, unspecified eye: Secondary | ICD-10-CM

## 2020-05-17 DIAGNOSIS — E1169 Type 2 diabetes mellitus with other specified complication: Secondary | ICD-10-CM | POA: Diagnosis not present

## 2020-05-17 DIAGNOSIS — E785 Hyperlipidemia, unspecified: Secondary | ICD-10-CM

## 2020-05-17 DIAGNOSIS — Z6841 Body Mass Index (BMI) 40.0 and over, adult: Secondary | ICD-10-CM

## 2020-05-17 DIAGNOSIS — Z1159 Encounter for screening for other viral diseases: Secondary | ICD-10-CM | POA: Diagnosis not present

## 2020-05-17 LAB — CBC WITH DIFFERENTIAL/PLATELET
Basophils Relative: 1.1 %
MPV: 10 fL (ref 7.5–12.5)

## 2020-05-17 LAB — POCT UA - MICROALBUMIN: Microalbumin Ur, POC: 0 mg/L

## 2020-05-17 NOTE — Patient Instructions (Addendum)
Thank you for coming to the office today.  Labs done today - stay tuned for results with A1c  Urine microalbumin protein test for checking kidney health in diabetes  Rybelsus   IF you start the new medication - STOP Glimepiride (Amaryl) 2mg  twice a day.  KEEP current Metformin as you are taking it now, no change until the future when on higher dose of Rybelsus  After 1st 30 days of Rybelsus 3mg  low dose, we will INCREASE it and order NEW PILL - for 7mg  then again in 3 months dose increase if need.  Call insurance find cost and coverage of the following   Rybelsus (pill - oral semaglutide or ozempic) - once daily, taken first thing in morning without other meds  3 benefits - 1 significantly reduced A1c sugar, and may be able to reduce or stop metformin in future - 2 reduced appetite and weight loss with good results - 3 cardiovascular risk reduction, less likely to have heart attack/stroke  ----------------------------------------  Stay tuned for call back on A1c lab result and we can order med if you are interested and if it is covered or affordable  1st rx 30 day only  Please schedule a Follow-up Appointment to: Return in about 3 months (around 08/17/2020) for 3 month DM A1c.  If you have any other questions or concerns, please feel free to call the office or send a message through [facility]. You may also schedule an earlier appointment if necessary.  Additionally, you may be receiving a survey about your experience at our office within a few days to 1 week by e-mail or mail. We value your feedback.  Nobie Putnam, DO [facility]

## 2020-05-17 NOTE — Assessment & Plan Note (Addendum)
Previously reported Well-controlled DM with A1c approximately 6-7 Complications - hyperlipidemia, GERD,  morbid obesity, - increases risk of future cardiovascular complications   Plan:  1. Continue current therapy - Metformin XR 500mg  x 1 breakfast / 1 lunch / 2 supper, and Glimepiride 2mg  BID wc - Discussion today on counseling about benefits and advantage of newer diabetic therapy with GLP1 therapy, she declines injections. She is interested in Rybelsus discussed dosing and regimen - she will check cost/cover and based on upcoming lab today A1c - we can contact her and order 30 day Rybelsus 3mg  daily BEFORE breakfast on empty stomach 30 min.  - if starts Rybelsus, she would KEEP Metformin dose for now and DISCONTINUE Glimepiride for now.  2. Encourage improved lifestyle - low carb, low sugar diet, reduce portion size, continue improving regular exercise 3. Check CBG , bring log to next visit for review 4. Continue ACEi  Check Urine Microalbumin today
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
## 2020-05-17 NOTE — Assessment & Plan Note (Addendum)
Previously reported controlled cholesterol on lifestyle Reviewed lab lipid from prior PCP Elevated ASCVD based on age + Diabetes  Plan: 1. Offered statin therapy. She declines today due to concerns regarding side effects despite counseling on benefits. - Will check fasting lipid and rediscuss statin in future 2. Encourage improved lifestyle - low carb/cholesterol, reduce portion size, continue improving regular exercise

## 2020-05-17 NOTE — Assessment & Plan Note (Signed)
Secondary to DM 

## 2020-05-18 LAB — COMPLETE METABOLIC PANEL WITH GFR
AG Ratio: 1.6 (calc) (ref 1.0–2.5)
ALT: 12 U/L (ref 6–29)
AST: 16 U/L (ref 10–35)
Albumin: 4.5 g/dL (ref 3.6–5.1)
Alkaline phosphatase (APISO): 57 U/L (ref 37–153)
BUN: 22 mg/dL (ref 7–25)
CO2: 26 mmol/L (ref 20–32)
Calcium: 10.3 mg/dL (ref 8.6–10.4)
Chloride: 102 mmol/L (ref 98–110)
Creat: 0.95 mg/dL (ref 0.50–0.99)
GFR, Est African American: 72 mL/min/{1.73_m2} (ref >=60)
GFR, Est Non African American: 62 mL/min/{1.73_m2} (ref >=60)
Globulin: 2.9 g/dL (calc) (ref 1.9–3.7)
Glucose, Bld: 103 mg/dL — ABNORMAL HIGH (ref 65–99)
Potassium: 4.2 mmol/L (ref 3.5–5.3)
Sodium: 141 mmol/L (ref 135–146)
Total Bilirubin: 0.3 mg/dL (ref 0.2–1.2)
Total Protein: 7.4 g/dL (ref 6.1–8.1)

## 2020-05-18 LAB — CBC WITH DIFFERENTIAL/PLATELET
Absolute Monocytes: 474 cells/uL (ref 200–950)
Basophils Absolute: 81 cells/uL (ref 0–200)
Eosinophils Absolute: 192 cells/uL (ref 15–500)
Eosinophils Relative: 2.6 %
HCT: 38.2 % (ref 35.0–45.0)
Hemoglobin: 12.6 g/dL (ref 11.7–15.5)
Lymphs Abs: 2753 cells/uL (ref 850–3900)
MCH: 29 pg (ref 27.0–33.0)
MCHC: 33 g/dL (ref 32.0–36.0)
MCV: 88 fL (ref 80.0–100.0)
Monocytes Relative: 6.4 %
Neutro Abs: 3900 cells/uL (ref 1500–7800)
Neutrophils Relative %: 52.7 %
Platelets: 447 10*3/uL — ABNORMAL HIGH (ref 140–400)
RBC: 4.34 10*6/uL (ref 3.80–5.10)
RDW: 13 % (ref 11.0–15.0)
Total Lymphocyte: 37.2 %
WBC: 7.4 10*3/uL (ref 3.8–10.8)

## 2020-05-18 LAB — LIPID PANEL
Cholesterol: 191 mg/dL (ref <200)
HDL: 60 mg/dL (ref >=50)
LDL Cholesterol (Calc): 108 mg/dL (calc) — ABNORMAL HIGH
Non-HDL Cholesterol (Calc): 131 mg/dL (calc) — ABNORMAL HIGH (ref <130)
Total CHOL/HDL Ratio: 3.2 (calc) (ref <5.0)
Triglycerides: 118 mg/dL (ref <150)

## 2020-05-18 LAB — HEPATITIS C ANTIBODY
Hepatitis C Ab: NONREACTIVE
SIGNAL TO CUT-OFF: 0.01 (ref <1.00)

## 2020-05-18 LAB — TSH: TSH: 0.65 mIU/L (ref 0.40–4.50)

## 2020-05-18 LAB — HEMOGLOBIN A1C
Hgb A1c MFr Bld: 6.5 % of total Hgb — ABNORMAL HIGH (ref <5.7)
Mean Plasma Glucose: 140 (calc)
eAG (mmol/L): 7.7 (calc)

## 2020-05-18 MED ORDER — RYBELSUS 3 MG PO TABS: 3.0000 mg | ORAL_TABLET | Freq: Every day | ORAL | 0 refills | Status: DC

## 2020-05-18 NOTE — Addendum Note (Signed)
Addended by: Olin Hauser on: 05/18/2020 12:24 PM   Modules accepted: Orders

## 2020-06-08 ENCOUNTER — Telehealth: Payer: Self-pay

## 2020-06-08 NOTE — Telephone Encounter (Signed)
Copied from [place] [phone]. Topic: General - Other >> Jun 06, 2020 10:11 AM Rainey Pines A wrote: Patient stated that she received notification from pharmacy that theey were in the process of pushing an order for her  glimepiride (AMARYL) 2 MG tablet  through. Patient stated that she needs PCP to [facility] and [facility] the prescription since she was advised by pcp to no loner take medication .Please advise

## 2020-06-08 NOTE — Telephone Encounter (Signed)
[facility] long hold unable to reach out to anyone and the med's is not active in patient's list.

## 2020-06-14 ENCOUNTER — Telehealth: Payer: Self-pay | Admitting: Family Medicine

## 2020-06-14 DIAGNOSIS — E1169 Type 2 diabetes mellitus with other specified complication: Secondary | ICD-10-CM

## 2020-06-14 MED ORDER — RYBELSUS 7 MG PO TABS: 7.0000 mg | ORAL_TABLET | Freq: Every day | ORAL | 2 refills | Status: DC

## 2020-06-14 NOTE — Telephone Encounter (Signed)
Patient informed. 

## 2020-06-14 NOTE — Telephone Encounter (Signed)
Please notify patient. New rx Rybelsus 7mg  ordered to her pharmacy. I did 30 pills +2 refills, if she prefers 90 day we can send that in future, let us know.  Nobie Putnam, [facility] 06/14/2020, 1:28 PM

## 2020-06-14 NOTE — Telephone Encounter (Signed)
Pt is taking rybelsus 3 mg and in 30 day she suppose to increase to 7 mg. Pt will run out of 3 mg on 06/22/2020 and would like new rx rybelsus 7 mg  to be sent to [facility] [address]

## 2020-07-08 ENCOUNTER — Telehealth: Payer: Self-pay

## 2020-07-08 NOTE — Telephone Encounter (Signed)
Patient was advised to inquire at the pharmacy she will call them tomorrow.

## 2020-07-08 NOTE — Telephone Encounter (Signed)
Copied from [place] [id number]. Topic: General - Other >> Jul 08, 2020  3:38 PM Leward Quan A wrote: Reason for CRM: Patient called in to inform Dr Raliegh Ip that she seen where there is a recall on Metformin 750 MG she takes the 500 MG but is concerned and need a call back to let her know if it is still safe for her to keep taking this medication. Please call  Ph# [phone]

## 2020-07-20 ENCOUNTER — Telehealth: Payer: Self-pay

## 2020-07-20 DIAGNOSIS — E1169 Type 2 diabetes mellitus with other specified complication: Secondary | ICD-10-CM

## 2020-07-20 MED ORDER — RYBELSUS 7 MG PO TABS: 7.0000 mg | ORAL_TABLET | Freq: Every day | ORAL | 1 refills | Status: DC

## 2020-07-20 NOTE — Telephone Encounter (Signed)
Called patient.  1 more month until next apt and A1c  We agree send Rx Rybelsus 7mg  [facility] now, adjust in future if needed  May reduce metformin and inc rybelsus  Nobie Putnam, [facility] 07/20/2020, 5:54 PM

## 2020-07-20 NOTE — Telephone Encounter (Signed)
Copied from [place] [id number]. Topic: Quick Communication - See Telephone Encounter >> Jul 19, 2020  9:04 AM Loma Boston wrote: CRM for notification. See Telephone encounter for: 07/19/20.RYBELSUS 7 MG TABS 30 tablet 2 06/14/2020   Sig - Route: Take 7 mg by mouth daily before breakfast. - Oral  Sent to pharmacy as: RYBELSUS 7 MG Tab  Pt has called in a stated Dr Raliegh Ip said he was going to try her at different MG on this med before ordering and establishing at [place].  She has had the 3 mg and now the 7 mg. She will soon be out and wanting it to be [facility] due to 90 day 1 [phone]

## 2020-07-28 ENCOUNTER — Telehealth: Payer: Self-pay | Admitting: Family Medicine

## 2020-07-28 ENCOUNTER — Telehealth: Payer: Self-pay

## 2020-07-28 NOTE — Telephone Encounter (Signed)
Copied from [place] [id number]. Topic: Quick Communication - See Telephone Encounter >> Jul 28, 2020 11:18 AM Keene Breath wrote: Patient would like to the nurse to call her regarding her medication which seems to be delayed [facility].  Patient would have been without her meds. For more than 5 days and is not sure of the side effects without taking her medication.  Please advise and call patient to discuss at [phone]

## 2020-07-28 NOTE — Telephone Encounter (Signed)
Please provide the patient reassurance that at it is okay to be off the medication for a week or more due to waiting on her new rx to arrive.   She does not need an interim medication.  I am not sure what the delay in receiving the medication is.  It was ordered and sent to [facility] on 07/20/20.  I am not sure if we need to contact [facility] to confirm that the rx is still on the way? Or if they told her when it is expected to deliver.  Nobie Putnam, [facility] 07/28/2020, 1:46 PM

## 2020-07-28 NOTE — Telephone Encounter (Signed)
Forwarded to [facility] by mistake pt has concerns about medication, thank you

## 2020-07-28 NOTE — Telephone Encounter (Signed)
I called and left a detail message on the patient vm that the prescription was sent over to [facility] on 07/20/20.

## 2020-07-28 NOTE — Telephone Encounter (Signed)
Patient would like to be contacted by PCP to discuss the potential effects of being unable to take her medication (Rybelsus 7mg ) if it is delayed by mail. Patient is seeking clarity as well as a solution in the event that a delay occurs. Patient would also like to inquire about alternatives as well as temporary prescriptions.

## 2020-08-22 ENCOUNTER — Ambulatory Visit (INDEPENDENT_AMBULATORY_CARE_PROVIDER_SITE_OTHER): Payer: Medicare HMO | Admitting: Family Medicine

## 2020-08-22 ENCOUNTER — Encounter: Payer: Self-pay | Admitting: Family Medicine

## 2020-08-22 ENCOUNTER — Other Ambulatory Visit: Payer: Self-pay

## 2020-08-22 VITALS — BP 140/60 | HR 86 | Ht 59.0 in | Wt 250.8 lb

## 2020-08-22 DIAGNOSIS — E1169 Type 2 diabetes mellitus with other specified complication: Secondary | ICD-10-CM | POA: Diagnosis not present

## 2020-08-22 DIAGNOSIS — Z1231 Encounter for screening mammogram for malignant neoplasm of breast: Secondary | ICD-10-CM

## 2020-08-22 NOTE — Assessment & Plan Note (Signed)
Initial POC A1c up to 8.1 unsure if accurate with home CBG readings consistently 120 range avg reported Previously reported Well-controlled DM with A1c approximately 6-7 Complications - hyperlipidemia, GERD,  morbid obesity, - increases risk of future cardiovascular complications   Plan:  1. RE-CHECK A1c today lab draw 2. For now - continue current therapy - Rybelsus 7mg  daily (empty stomach, she is taking it properly today on history) - Metformin XR 500mg  x 1 breakfast / 1 lunch / 2 supper  If A1c is >7.5 to 8% or higher - She can double up of the Rybelsus 7mg  x 2 = 14mg , and when ready soon I can order Rybelsus 14mg  daily, higher dose  If A1c is < 7.5% - we can keep current therapy Metformin + Rybelsus 7mg  daily and keep improving diet  2. Encourage improved lifestyle - low carb, low sugar diet, reduce portion size, continue improving regular exercise 3. Check CBG , bring log to next visit for review 4. Continue ACEi

## 2020-08-22 NOTE — Patient Instructions (Addendum)
Thank you for coming to the office today.  Keep checking sugar regularly.  We will check the lab draw sugar A1c test today Fingerstick was 8.1% but may not be accurate  If new lab check shows A1c >8% or still higher 7 range, we will DOUBLE RYbelsus 7mg , take TWO of them at one time back to back, just as you are, then let me know when ready and I can order the Rybelsus 14mg  dosage - highest dose. I can try to order 90 day if you prefer as well.  If new lab check is 6 range or low 7, perhaps then the first one was incorrect and we can KEEP current dose Rybelsus 7mg .  Keep reducing your sugar intake.  For Mammogram screening for breast cancer   Call the [facility] below anytime to schedule your own appointment now that order has been placed.  [facility] [address] [place], [place] [address] Phone: [phone]   Please schedule a Follow-up Appointment to: Return in about 3 months (around 11/19/2020) for 3 month DM A1c (can re-schedule or cancel the March 2022 visit).  If you have any other questions or concerns, please feel free to call the office or send a message through [place]. You may also schedule an earlier appointment if necessary.  Additionally, you may be receiving a survey about your experience at our office within a few days to 1 week by e-mail or mail. We value your feedback.  Nobie Putnam, DO [place]
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
## 2020-08-23 ENCOUNTER — Inpatient Hospital Stay
Admission: RE | Admit: 2020-08-23 | Discharge: 2020-08-23 | Disposition: A | Discharge status: Home / Self Care | Payer: Self-pay | Source: Ambulatory Visit | Attending: *Deleted | Admitting: *Deleted

## 2020-08-23 ENCOUNTER — Other Ambulatory Visit: Payer: Self-pay | Admitting: *Deleted

## 2020-08-23 DIAGNOSIS — Z1231 Encounter for screening mammogram for malignant neoplasm of breast: Secondary | ICD-10-CM

## 2020-08-23 LAB — HEMOGLOBIN A1C
Hgb A1c MFr Bld: 8.5 % of total Hgb — ABNORMAL HIGH (ref <5.7)
Mean Plasma Glucose: 197 mg/dL
eAG (mmol/L): 10.9 mmol/L

## 2020-08-31 LAB — HM DIABETES EYE EXAM

## 2020-09-06 ENCOUNTER — Ambulatory Visit: Payer: Medicare HMO | Admitting: Family Medicine

## 2020-09-15 ENCOUNTER — Ambulatory Visit
Admission: RE | Admit: 2020-09-15 | Discharge: 2020-09-15 | Disposition: A | Discharge status: Home / Self Care | Payer: Medicare HMO | Source: Ambulatory Visit | Attending: Family Medicine | Admitting: Family Medicine

## 2020-09-15 ENCOUNTER — Other Ambulatory Visit: Payer: Self-pay

## 2020-09-15 DIAGNOSIS — Z1231 Encounter for screening mammogram for malignant neoplasm of breast: Secondary | ICD-10-CM | POA: Diagnosis present

## 2020-10-14 ENCOUNTER — Other Ambulatory Visit: Payer: Self-pay | Admitting: Family Medicine

## 2020-10-14 DIAGNOSIS — E1169 Type 2 diabetes mellitus with other specified complication: Secondary | ICD-10-CM

## 2020-10-14 MED ORDER — RYBELSUS 7 MG PO TABS: 7.0000 mg | ORAL_TABLET | Freq: Every day | ORAL | 1 refills | Status: DC

## 2020-10-14 NOTE — Telephone Encounter (Signed)
Medication Refill - Medication:  90 day supply RYBELSUS 7 MG TABS   Has the patient contacted their pharmacy? Yes.    (Agent: If yes, when and what did the pharmacy advise?) Contact PCP office  Preferred Pharmacy (with phone number or street name):   [facility] - [address], [place] Phone:  [phone]  Fax:  [phone]      Agent: Please be advised that RX refills may take up to 3 business days. We ask that you follow-up with your pharmacy.

## 2020-10-14 NOTE — Telephone Encounter (Signed)
Requested medication (s) are due for refill today:yes  Requested medication (s) are on the active medication list: yes   Future visit scheduled: yes  Notes to clinic: Medication not assigned to a protocol, review manually.   Requested Prescriptions  Pending Prescriptions Disp Refills   RYBELSUS 7 MG TABS 90 tablet 1    Sig: Take 7 mg by mouth daily before breakfast.      Off-Protocol Failed - 10/14/2020 10:05 AM      Failed - Medication not assigned to a protocol, review manually.      Passed - Valid encounter within last 12 months    Recent Outpatient Visits           1 month ago Type 2 diabetes mellitus with other specified complication, without long-term current use of insulin ([place])   [facility], Devonne Doughty, DO   5 months ago Type 2 diabetes mellitus with other specified complication, without long-term current use of insulin [facility])   [place], DO   8 months ago Type 2 diabetes mellitus with other specified complication, without long-term current use of insulin ([place])   [facility] [place], Devonne Doughty, DO       Future Appointments             In 1 month [place], Devonne Doughty, DO [facility]

## 2020-11-14 ENCOUNTER — Telehealth: Payer: Self-pay | Admitting: Family Medicine

## 2020-11-14 NOTE — Telephone Encounter (Signed)
Copied from [place] [id number]. Topic: Medicare AWV >> Nov 14, 2020 10:51 AM Cher Nakai R wrote: Reason for CRM:  Left message for patient to call back and schedule Medicare Annual Wellness Visit (AWV) to be done virtually or by telephone.  No hx of AWV eligible as of 10/01/2018 awvi  Please schedule at anytime with [facility] Health Advisor.      40 Minutes appointment   Any questions, please call me at [phone]

## 2020-11-15 NOTE — Telephone Encounter (Signed)
Pt called stating that she is not interested at this time. Please advise.

## 2020-12-05 ENCOUNTER — Other Ambulatory Visit: Payer: Self-pay

## 2020-12-05 ENCOUNTER — Other Ambulatory Visit: Payer: Self-pay | Admitting: Family Medicine

## 2020-12-05 ENCOUNTER — Ambulatory Visit: Payer: Medicare HMO | Admitting: Family Medicine

## 2020-12-05 ENCOUNTER — Encounter: Payer: Self-pay | Admitting: Family Medicine

## 2020-12-05 VITALS — BP 140/44 | HR 75 | Ht 59.0 in | Wt 238.0 lb

## 2020-12-05 DIAGNOSIS — Z6841 Body Mass Index (BMI) 40.0 and over, adult: Secondary | ICD-10-CM

## 2020-12-05 DIAGNOSIS — E1169 Type 2 diabetes mellitus with other specified complication: Secondary | ICD-10-CM

## 2020-12-05 DIAGNOSIS — Z0000 Encounter for general adult medical examination without abnormal findings: Secondary | ICD-10-CM

## 2020-12-05 LAB — POCT GLYCOSYLATED HEMOGLOBIN (HGB A1C): Hemoglobin A1C: 7.2 % — AB (ref 4.0–5.6)

## 2020-12-05 NOTE — Assessment & Plan Note (Signed)
Dramatic improved A1c down to 7.2 on lifestyle, wt loss and GLP1 oral Rybelsus Last A1c was up to 8.5 Complications - hyperlipidemia, GERD,  morbid obesity, - increases risk of future cardiovascular complications   Plan:  INCREASE dose Rybelsus from 7mg  up to 14mg  - she has 2 bottles of 90 day can double up dose, has enough for 1-3 months. She will call us after 12/30/20 for [place] to order new rx Rybelsus 14mg  [facility]  - Metformin XR 500mg  x 1 breakfast / 1 lunch / 2 supper  If A1c is >7.5 to 8% or higher - She can double up of the Rybelsus 7mg  x 2 = 14mg , and when ready soon I can order Rybelsus 14mg  daily, higher dose  2. Encourage improved lifestyle - low carb, low sugar diet, reduce portion size, continue improving regular exercise 3. Check CBG , bring log to next visit for review 4. Continue ACEi

## 2020-12-05 NOTE — Patient Instructions (Addendum)
Thank you for coming to the office today.  Please call us here at our office to request dose change of Rybelsus to 14mg  dosage, new dose - contact us after July 1st. I can order it to [facility].  Recent Labs    05/17/20 1132 08/22/20 0913 12/05/20 0856  HGBA1C 6.5* 8.5* 7.2*    Keep up the great work, you are doing excellent.  Weight down from 252 down to 38 lbs in past 7 months.   DUE for FASTING BLOOD WORK (no food or drink after midnight before the lab appointment, only water or coffee without cream/sugar on the morning of)  SCHEDULE "Lab Only" visit in the morning at the clinic for lab draw in 6 MONTHS   - Make sure Lab Only appointment is at about 1 week before your next appointment, so that results will be available  For Lab Results, once available within 2-3 days of blood draw, you can can log in to MyChart online to view your results and a brief explanation. Also, we can discuss results at next follow-up visit.   Please schedule a Follow-up Appointment to: Return in about 5 months (around 05/07/2021) for 5 month fasting lab only then 1 week later Annual Physical.  If you have any other questions or concerns, please feel free to call the office or send a message through [place]. You may also schedule an earlier appointment if necessary.  Additionally, you may be receiving a survey about your experience at our office within a few days to 1 week by e-mail or mail. We value your feedback.  Nobie Putnam, DO [place]

## 2020-12-05 NOTE — Progress Notes (Signed)
Subjective:    Patient ID: Tina Carey, female    DOB: 1951/05/25, 70 y.o.   MRN: [id number]  Tina Carey is a 70 y.o. female presenting on 12/05/2020 for Diabetes   HPI   CHRONIC DM, Type 2 Morbid Obesity BMI >48  No alcohol since 1993 fam history brother/sister, paternalgrandmother with diabetes  She has had dramatic improvement on Rybelsus, diet and lifestyle overhaul. A1c up to 8.5 previously, now due for A1c repeat She has been increased on Rybelsus 3mg  up to 7mg . She has avoided carbs/sweets and stays active walking regularly.  CBGs:Avg 104-150 Meds:Metformin XR 500mg  1 AM / 1 afternoon / 2 PM, Rybelsus 7mg  daily Reports good compliance. Tolerating well w/o side-effects Currently on ACEi Lifestyle: - Diet (avoiding sweets) - Exercise (walking regularly) Denies hypoglycemia, polyuria, visual changes, numbness or tingling.   Depression screen [facility] 2/9 12/05/2020 05/17/2020 02/10/2020  Decreased Interest 0 0 0  Down, Depressed, Hopeless 0 0 0  PHQ - 2 Score 0 0 0  Altered sleeping 0 - -  Tired, decreased energy 0 - -  Change in appetite 1 - -  Feeling bad or failure about yourself  0 - -  Trouble concentrating 0 - -  Moving slowly or fidgety/restless 0 - -  Suicidal thoughts 0 - -  PHQ-9 Score 1 - -  Difficult doing work/chores Not difficult at all - -    Social History   Tobacco Use  . Smoking status: Never Smoker  . Smokeless tobacco: Never Used  Vaping Use  . Vaping Use: Never used  Substance Use Topics  . Alcohol use: No  . Drug use: Never    Review of Systems Per HPI unless specifically indicated above     Objective:    BP (!) 140/44   Pulse 75   Ht 4\' 11"  (1.499 m)   Wt 238 lb (108 kg)   SpO2 100%   BMI 48.07 kg/m   Wt Readings from Last 3 Encounters:  12/05/20 238 lb (108 kg)  08/22/20 250 lb 12.8 oz (113.8 kg)  05/17/20 252 lb (114.3 kg)    Physical Exam Vitals and nursing note reviewed.  Constitutional:      General: She  is not in acute distress.    Appearance: She is well-developed. She is obese. She is not diaphoretic.     Comments: Well-appearing, comfortable, cooperative  HENT:     Head: Normocephalic and atraumatic.  Eyes:     General:        Right eye: No discharge.        Left eye: No discharge.     Conjunctiva/sclera: Conjunctivae normal.  Cardiovascular:     Rate and Rhythm: Normal rate.  Pulmonary:     Effort: Pulmonary effort is normal.  Skin:    General: Skin is warm and dry.     Findings: No erythema or rash.  Neurological:     Mental Status: She is alert and oriented to person, place, and time.  Psychiatric:        Behavior: Behavior normal.     Comments: Well groomed, good eye contact, normal speech and thoughts      Results for orders placed or performed in visit on 12/05/20  POCT HgB A1C  Result Value Ref Range   Hemoglobin A1C 7.2 (A) 4.0 - 5.6 %   Recent Labs    05/17/20 1132 08/22/20 0913 12/05/20 0856  HGBA1C 6.5* 8.5* 7.2*  Assessment & Plan:   Problem List Items Addressed This Visit    Type 2 diabetes mellitus with other specified complication ([place]) - Primary    Dramatic improved A1c down to 7.2 on lifestyle, wt loss and GLP1 oral Rybelsus Last A1c was up to 8.5 Complications - hyperlipidemia, GERD,  morbid obesity, - increases risk of future cardiovascular complications   Plan:  INCREASE dose Rybelsus from 7mg  up to 14mg  - she has 2 bottles of 90 day can double up dose, has enough for 1-3 months. She will call us after 12/30/20 for [place] to order new rx Rybelsus 14mg  [facility]  - Metformin XR 500mg  x 1 breakfast / 1 lunch / 2 supper  If A1c is >7.5 to 8% or higher - She can double up of the Rybelsus 7mg  x 2 = 14mg , and when ready soon I can order Rybelsus 14mg  daily, higher dose  2. Encourage improved lifestyle - low carb, low sugar diet, reduce portion size, continue improving regular exercise 3. Check CBG , bring log to next visit for review 4.  Continue ACEi      Relevant Orders   POCT HgB A1C (Completed)   Morbid obesity with BMI of 45.0-49.9, adult ([place])      No orders of the defined types were placed in this encounter.     Follow up plan: Return in about 5 months (around 05/07/2021) for 5 month fasting lab only then 1 week later Annual Physical.  Future labs ordered for 05/02/21   Tina Carey, [facility] 12/05/2020, 8:56 AM

## 2020-12-26 ENCOUNTER — Telehealth: Payer: Self-pay

## 2020-12-26 NOTE — Telephone Encounter (Signed)
Copied from [place] [id number]. Topic: General - Inquiry >> Dec 26, 2020 10:36 AM Loma Boston wrote: Reason for CRM:pt is wanting to continue the RYBELSUS 7 MG TABS  and wanting to stay on it, not increase but discontinue another med, please reach out to pt to advise fu @ [phone]

## 2021-01-05 ENCOUNTER — Other Ambulatory Visit: Payer: Self-pay | Admitting: Family Medicine

## 2021-01-05 ENCOUNTER — Telehealth: Payer: Self-pay | Admitting: *Deleted

## 2021-01-05 DIAGNOSIS — E1169 Type 2 diabetes mellitus with other specified complication: Secondary | ICD-10-CM

## 2021-01-05 MED ORDER — RYBELSUS 7 MG PO TABS: 7.0000 mg | ORAL_TABLET | Freq: Every day | ORAL | 1 refills | Status: DC

## 2021-01-05 NOTE — Telephone Encounter (Signed)
Reviewed chart.   We discussed consider inc dose from Rybelsus 7 to 14mg . But she is requesting to keep 7mg  dose. That is no problem.  Please let her know  Sent more refills Rybelsus 7mg  - can talk at next apt.  Nobie Putnam, [facility] 01/05/2021, 4:51 PM

## 2021-01-05 NOTE — Telephone Encounter (Signed)
error 

## 2021-01-05 NOTE — Telephone Encounter (Signed)
Pt calling in again as she has not received a response. She states that she would like to continue on this Rybelsus 7mg  until her appt in November. Please advise.

## 2021-02-13 ENCOUNTER — Other Ambulatory Visit: Payer: Self-pay | Admitting: Family Medicine

## 2021-02-13 DIAGNOSIS — E1169 Type 2 diabetes mellitus with other specified complication: Secondary | ICD-10-CM

## 2021-02-27 ENCOUNTER — Other Ambulatory Visit: Payer: Self-pay | Admitting: Family Medicine

## 2021-02-27 DIAGNOSIS — K219 Gastro-esophageal reflux disease without esophagitis: Secondary | ICD-10-CM

## 2021-02-27 DIAGNOSIS — E1169 Type 2 diabetes mellitus with other specified complication: Secondary | ICD-10-CM

## 2021-03-01 LAB — HM DIABETES EYE EXAM

## 2021-03-02 ENCOUNTER — Encounter: Payer: Self-pay | Admitting: Family Medicine

## 2021-05-01 ENCOUNTER — Other Ambulatory Visit: Payer: Self-pay

## 2021-05-01 DIAGNOSIS — Z0000 Encounter for general adult medical examination without abnormal findings: Secondary | ICD-10-CM

## 2021-05-01 DIAGNOSIS — Z6841 Body Mass Index (BMI) 40.0 and over, adult: Secondary | ICD-10-CM

## 2021-05-01 DIAGNOSIS — E785 Hyperlipidemia, unspecified: Secondary | ICD-10-CM

## 2021-05-01 DIAGNOSIS — E1169 Type 2 diabetes mellitus with other specified complication: Secondary | ICD-10-CM

## 2021-05-02 ENCOUNTER — Other Ambulatory Visit: Payer: Self-pay

## 2021-05-02 ENCOUNTER — Other Ambulatory Visit: Payer: Medicare HMO

## 2021-05-03 LAB — COMPLETE METABOLIC PANEL WITH GFR
AG Ratio: 1.6 (calc) (ref 1.0–2.5)
ALT: 10 U/L (ref 6–29)
AST: 15 U/L (ref 10–35)
Albumin: 4.3 g/dL (ref 3.6–5.1)
Alkaline phosphatase (APISO): 53 U/L (ref 37–153)
BUN: 15 mg/dL (ref 7–25)
CO2: 28 mmol/L (ref 20–32)
Calcium: 10.3 mg/dL (ref 8.6–10.4)
Chloride: 103 mmol/L (ref 98–110)
Creat: 0.79 mg/dL (ref 0.50–1.05)
Globulin: 2.7 g/dL (calc) (ref 1.9–3.7)
Glucose, Bld: 128 mg/dL (ref 65–139)
Potassium: 4.6 mmol/L (ref 3.5–5.3)
Sodium: 141 mmol/L (ref 135–146)
Total Bilirubin: 0.3 mg/dL (ref 0.2–1.2)
Total Protein: 7 g/dL (ref 6.1–8.1)
eGFR: 81 mL/min/{1.73_m2} (ref >=60)

## 2021-05-03 LAB — CBC WITH DIFFERENTIAL/PLATELET
Absolute Monocytes: 447 cells/uL (ref 200–950)
Basophils Absolute: 57 cells/uL (ref 0–200)
Basophils Relative: 0.9 %
Eosinophils Absolute: 221 cells/uL (ref 15–500)
Eosinophils Relative: 3.5 %
HCT: 39.4 % (ref 35.0–45.0)
Hemoglobin: 12.5 g/dL (ref 11.7–15.5)
Lymphs Abs: 2003 cells/uL (ref 850–3900)
MCH: 28.8 pg (ref 27.0–33.0)
MCHC: 31.7 g/dL — ABNORMAL LOW (ref 32.0–36.0)
MCV: 90.8 fL (ref 80.0–100.0)
MPV: 9.7 fL (ref 7.5–12.5)
Monocytes Relative: 7.1 %
Neutro Abs: 3572 cells/uL (ref 1500–7800)
Neutrophils Relative %: 56.7 %
Platelets: 377 10*3/uL (ref 140–400)
RBC: 4.34 10*6/uL (ref 3.80–5.10)
RDW: 13.1 % (ref 11.0–15.0)
Total Lymphocyte: 31.8 %
WBC: 6.3 10*3/uL (ref 3.8–10.8)

## 2021-05-03 LAB — HEMOGLOBIN A1C
Hgb A1c MFr Bld: 6.8 % of total Hgb — ABNORMAL HIGH (ref <5.7)
Mean Plasma Glucose: 148 mg/dL
eAG (mmol/L): 8.2 mmol/L

## 2021-05-03 LAB — LIPID PANEL
Cholesterol: 188 mg/dL (ref <200)
HDL: 54 mg/dL (ref >=50)
LDL Cholesterol (Calc): 109 mg/dL (calc) — ABNORMAL HIGH
Non-HDL Cholesterol (Calc): 134 mg/dL (calc) — ABNORMAL HIGH (ref <130)
Total CHOL/HDL Ratio: 3.5 (calc) (ref <5.0)
Triglycerides: 136 mg/dL (ref <150)

## 2021-05-03 LAB — TSH: TSH: 0.7 mIU/L (ref 0.40–4.50)

## 2021-05-08 ENCOUNTER — Encounter: Payer: Self-pay | Admitting: Family Medicine

## 2021-05-08 ENCOUNTER — Other Ambulatory Visit: Payer: Self-pay

## 2021-05-08 ENCOUNTER — Ambulatory Visit (INDEPENDENT_AMBULATORY_CARE_PROVIDER_SITE_OTHER): Payer: Medicare HMO | Admitting: Family Medicine

## 2021-05-08 VITALS — BP 138/64 | HR 73 | Ht 59.0 in | Wt 223.8 lb

## 2021-05-08 DIAGNOSIS — E1169 Type 2 diabetes mellitus with other specified complication: Secondary | ICD-10-CM | POA: Diagnosis not present

## 2021-05-08 DIAGNOSIS — M8589 Other specified disorders of bone density and structure, multiple sites: Secondary | ICD-10-CM

## 2021-05-08 DIAGNOSIS — K219 Gastro-esophageal reflux disease without esophagitis: Secondary | ICD-10-CM

## 2021-05-08 DIAGNOSIS — Z0000 Encounter for general adult medical examination without abnormal findings: Secondary | ICD-10-CM | POA: Diagnosis not present

## 2021-05-08 DIAGNOSIS — E785 Hyperlipidemia, unspecified: Secondary | ICD-10-CM

## 2021-05-08 DIAGNOSIS — Z6841 Body Mass Index (BMI) 40.0 and over, adult: Secondary | ICD-10-CM

## 2021-05-08 MED ORDER — LISINOPRIL 10 MG PO TABS: 10.0000 mg | ORAL_TABLET | Freq: Every day | ORAL | 3 refills | Status: DC

## 2021-05-08 MED ORDER — PANTOPRAZOLE SODIUM 40 MG PO TBEC: 40.0000 mg | DELAYED_RELEASE_TABLET | Freq: Every day | ORAL | 3 refills | Status: DC

## 2021-05-08 NOTE — Assessment & Plan Note (Signed)
Controlled cholesterol The 10-year ASCVD risk score (Arnett DK, et al., 2019) is: 24.5%  Plan: 1. Offered statin therapy. She declines today due to concerns regarding side effects despite counseling on benefits. 2. Encourage improved lifestyle - low carb/cholesterol, reduce portion size, continue improving regular exercise

## 2021-05-08 NOTE — Assessment & Plan Note (Signed)
Dramatic improved A1c to 6.8, weight loss on GLP1 Complications - hyperlipidemia, GERD,  morbid obesity, - increases risk of future cardiovascular complications   Plan:  Continue Rybelsus 7 mg daily as discussed, she plans to inc dose to 14mg  in Jan/Feb 2023 and reduce metformin - Metformin XR 500mg  x 1 lunch / 2 supper - HOLD AM Breakfast dose. In future may skip lunch too  2. Encourage improved lifestyle - low carb, low sugar diet, reduce portion size, continue improving regular exercise 3. Check CBG , bring log to next visit for review 4. Continue ACEi 5 DM Foot

## 2021-05-08 NOTE — Patient Instructions (Addendum)
Thank you for coming to the office today.  In January will increase Rybelsus from 7 to 14, contact us for new order  REDUCE Metformin to currently 3 a day, skip AM, take 1 with lunch and 2 with dinner, then NEXT PLAN if still needed, you can SKIP AM / Lunch and then START ONLY with 2 at dinner.  Recent Labs    08/22/20 0913 12/05/20 0856 05/02/21 0848  HGBA1C 8.5* 7.2* 6.8*   For DEXA Scan (Bone mineral density) screening for osteoporosis  Call the [facility] below anytime to schedule your own appointment now that order has been placed.  [facility] [address], [place] [address] Phone: [phone]   Please schedule a Follow-up Appointment to: Return in about 3 months (around 08/08/2021) for 3 month follow up DM A1c med adjust, HTN.  If you have any other questions or concerns, please feel free to call the office or send a message through [place]. You may also schedule an earlier appointment if necessary.  Additionally, you may be receiving a survey about your experience at our office within a few days to 1 week by e-mail or mail. We value your feedback.  Nobie Putnam, DO [place]

## 2021-05-08 NOTE — Progress Notes (Signed)
Subjective:    Patient ID: Tina Carey, female    DOB: 08-Nov-1950, 70 y.o.   MRN: [id number]  Tina Carey is a 70 y.o. female presenting on 05/08/2021 for Annual Exam   HPI  Here for Annual Physical  CHRONIC DM, Type 2 Morbid Obesity BMI >45  No alcohol since 1993 fam history brother/sister, paternal grandmother with diabetes   She has had dramatic improvement on Rybelsus, diet and lifestyle overhaul. Still plans to keep improving, and she plans to go up to the Rybelsus 14 in Jan 2023 A1c down to 6.8 now improved  CBGs: Avg 104-150 Meds: Metformin XR 588m 1 AM / 1 afternoon / 2 PM, Rybelsus 739mdaily - She is asking about adjusting her Metformin. Too strong has some rare low sugars. Reports good compliance. Tolerating well w/o side-effects Currently on ACEi Lifestyle: Weight loss 27 lbs - Diet (avoiding sweets, avoiding eating late PM after 7) - Exercise (walking regularly, biking) Denies hypoglycemia, polyuria, visual changes, numbness or tingling.  HYPERLIPIDEMIA: - Reports no concerns. Last lipid panel 05/2021, controlled  Declines Statin therapy.   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 She has some good days and bad days. She has support system with family, daughters, grand children. She does not admit actual clinical depression. She declines medication  Health Maintenance:  Osteopenia - needs DEXA last 05/2019  Updated COVID19 booster shot 04/2021 Updated Flu Shot 03/2021  [facility] 05/08/2021 12/05/2020 05/17/2020  Decreased Interest 1 0 0  Down, Depressed, Hopeless 1 0 0  PHQ - 2 Score 2 0 0  Altered sleeping 1 0 -  Tired, decreased energy 1 0 -  Change in appetite 1 1 -  Feeling bad or failure about yourself  0 0 -  Trouble concentrating 1 0 -  Moving slowly or fidgety/restless 1 0 -  Suicidal thoughts 0 0 -  PHQ-9 Score 7 1 -  Difficult doing  work/chores Somewhat difficult Not difficult at all -    Past Medical History:  Diagnosis Date   Arthritis    knees   Cancer ([facility]   sinus   GERD (gastroesophageal reflux disease)    Hypertension    Osteoporosis    Past Surgical History:  Procedure Laterality Date   CATARACT EXTRACTION W/PHACO Left 06/17/2017   Procedure: CATARACT EXTRACTION PHACO AND INTRAOCULAR LENS PLACEMENT ([place];  Surgeon: BrLeandrew KoyanagiMD;  [facility]: Op[facility];  Laterality: Left;  Diabetic - oral meds Does NOT want to be first   CATARACT EXTRACTION W/PHACO Right 10/23/2017   Procedure: CATARACT EXTRACTION PHACO AND INTRAOCULAR LENS PLACEMENT [place];  Surgeon: BrLeandrew KoyanagiMD;  [facility] Service: Ophthalmology;  Laterality: Right;  diabetic -oral meds   GASTRIC BYPASS     NASAL SINUS SURGERY  2009   Social History   Socioeconomic History   Marital status: Married    Spouse name: Not on file   Number of children: Not on file   Years of education: Not on file   Highest education level: Not on file  Occupational History   Not on file  Tobacco Use   Smoking status: Never   Smokeless tobacco: Never  Vaping Use   Vaping Use: Never used  Substance and Sexual Activity   Alcohol use: No   Drug use: Never   Sexual activity: Not on file  Other Topics Concern   Not on file  Social History Narrative   Not on file   Social Determinants of Health   Financial Resource Strain: Not on file  Food Insecurity: Not on file  Transportation Needs: Not on file  Physical Activity: Not on file  Stress: Not on file  Social Connections: Not on file  Intimate Partner Violence: Not on file   Family History  Problem Relation Age of Onset   Stroke Mother    Diabetes Mother    Diabetes Brother    Diabetes Maternal Grandmother    Diabetes Paternal Grandmother    Current Outpatient Medications on File Prior to  Visit  Medication Sig   calcium carbonate (OS-CAL) 1250 (500 Ca) MG chewable tablet Chew 1 tablet by mouth daily.   Cholecalciferol (VITAMIN D3 PO) Take 200 Units by mouth daily.   Coenzyme Q-10 200 MG CAPS Take 200 mg by mouth daily.   cyanocobalamin 100 MCG tablet Take by mouth.   magnesium gluconate (MAGONATE) 500 MG tablet Take 500 mg by mouth 2 (two) times daily.   Multiple Vitamin (MULTIVITAMIN) capsule Take 1 capsule by mouth daily.   pyridOXINE (VITAMIN B-6) 50 MG tablet Take 50 mg by mouth daily.   RYBELSUS 14 MG TABS Take 14 mg by mouth daily.   Turmeric 500 MG TABS Take 500 mg by mouth daily.   metFORMIN (GLUCOPHAGE-XR) 500 MG 24 hr tablet Take 1 tablet with breakfast and 2 tablets with dinner. In future may reduce to only 2 tablets with dinner.   No current facility-administered medications on file prior to visit.    Review of Systems  Constitutional:  Negative for activity change, appetite change, chills, diaphoresis, fatigue and fever.  HENT:  Negative for congestion and hearing loss.   Eyes:  Negative for visual disturbance.  Respiratory:  Negative for cough, chest tightness, shortness of breath and wheezing.   Cardiovascular:  Negative for chest pain, palpitations and leg swelling.  Gastrointestinal:  Negative for abdominal pain, constipation, diarrhea, nausea and vomiting.  Genitourinary:  Negative for dysuria, frequency and hematuria.  Musculoskeletal:  Positive for arthralgias. Negative for neck pain.  Skin:  Negative for rash.  Neurological:  Negative for dizziness, weakness, light-headedness, numbness and headaches.  Hematological:  Negative for adenopathy.  Psychiatric/Behavioral:  Negative for behavioral problems, dysphoric mood and sleep disturbance.   Per HPI unless specifically indicated above      Objective:    BP 138/64 (BP Location: Left Arm, Cuff Size: Normal)   Pulse 73   Ht 4' 11"  (1.499 m)   Wt 223 lb 12.8 oz (101.5 kg)   SpO2 100%   BMI 45.20  kg/m   Wt Readings from Last 3 Encounters:  05/08/21 223 lb 12.8 oz (101.5 kg)  12/05/20 238 lb (108 kg)  08/22/20 250 lb 12.8 oz (113.8 kg)    Physical Exam Vitals and nursing note reviewed.  Constitutional:      General: She is not in acute distress.    Appearance: She is well-developed. She is obese. She is not diaphoretic.     Comments: Well-appearing, comfortable, cooperative  HENT:     Head: Normocephalic and atraumatic.  Eyes:     General:        Right eye: No discharge.        Left eye: No discharge.     Conjunctiva/sclera: Conjunctivae normal.     Pupils: Pupils are equal, round, and reactive to light.  Neck:  Thyroid: No thyromegaly.  Cardiovascular:     Rate and Rhythm: Normal rate and regular rhythm.     Pulses: Normal pulses.     Heart sounds: Normal heart sounds. No murmur heard. Pulmonary:     Effort: Pulmonary effort is normal. No respiratory distress.     Breath sounds: Normal breath sounds. No wheezing or rales.  Abdominal:     General: Bowel sounds are normal. There is no distension.     Palpations: Abdomen is soft. There is no mass.     Tenderness: There is no abdominal tenderness.  Musculoskeletal:        General: No tenderness. Normal range of motion.     Cervical back: Normal range of motion and neck supple.     Comments: Upper / Lower Extremities: - Normal muscle tone, strength bilateral upper extremities 5/5, lower extremities 5/5  Lymphadenopathy:     Cervical: No cervical adenopathy.  Skin:    General: Skin is warm and dry.     Findings: No erythema or rash.  Neurological:     Mental Status: She is alert and oriented to person, place, and time.     Comments: Distal sensation intact to light touch all extremities  Psychiatric:        Mood and Affect: Mood normal.        Behavior: Behavior normal.        Thought Content: Thought content normal.     Comments: Well groomed, good eye contact, normal speech and thoughts    Diabetic Foot  Exam - Simple   Simple Foot Form Diabetic Foot exam was performed with the following findings: Yes 05/08/2021  8:29 AM  Visual Inspection No deformities, no ulcerations, no other skin breakdown bilaterally: Yes Sensation Testing Intact to touch and monofilament testing bilaterally: Yes Pulse Check Posterior Tibialis and Dorsalis pulse intact bilaterally: Yes Comments      Results for orders placed or performed in visit on 05/01/21  TSH  Result Value Ref Range   TSH 0.70 0.40 - 4.50 mIU/L  Lipid panel  Result Value Ref Range   Cholesterol 188 <200 mg/dL   HDL 54 > OR = 50 mg/dL   Triglycerides 136 <150 mg/dL   LDL Cholesterol (Calc) 109 (H) mg/dL (calc)   Total CHOL/HDL Ratio 3.5 <5.0 (calc)   Non-HDL Cholesterol (Calc) 134 (H) <130 mg/dL (calc)  COMPLETE METABOLIC PANEL WITH GFR  Result Value Ref Range   Glucose, Bld 128 65 - 139 mg/dL   BUN 15 7 - 25 mg/dL   Creat 0.79 0.50 - 1.05 mg/dL   eGFR 81 > OR = 60 mL/min/1.29m   BUN/Creatinine Ratio NOT APPLICABLE 6 - 22 (calc)   Sodium 141 135 - 146 mmol/L   Potassium 4.6 3.5 - 5.3 mmol/L   Chloride 103 98 - 110 mmol/L   CO2 28 20 - 32 mmol/L   Calcium 10.3 8.6 - 10.4 mg/dL   Total Protein 7.0 6.1 - 8.1 g/dL   Albumin 4.3 3.6 - 5.1 g/dL   Globulin 2.7 1.9 - 3.7 g/dL (calc)   AG Ratio 1.6 1.0 - 2.5 (calc)   Total Bilirubin 0.3 0.2 - 1.2 mg/dL   Alkaline phosphatase (APISO) 53 37 - 153 U/L   AST 15 10 - 35 U/L   ALT 10 6 - 29 U/L  CBC with Differential/Platelet  Result Value Ref Range   WBC 6.3 3.8 - 10.8 Thousand/uL   RBC 4.34 3.80 - 5.10 Million/uL  Hemoglobin 12.5 11.7 - 15.5 g/dL   HCT 39.4 35.0 - 45.0 %   MCV 90.8 80.0 - 100.0 fL   MCH 28.8 27.0 - 33.0 pg   MCHC 31.7 (L) 32.0 - 36.0 g/dL   RDW 13.1 11.0 - 15.0 %   Platelets 377 140 - 400 Thousand/uL   MPV 9.7 7.5 - 12.5 fL   Neutro Abs 3,572 1,500 - 7,800 cells/uL   Lymphs Abs 2,003 850 - 3,900 cells/uL   Absolute Monocytes 447 200 - 950 cells/uL    Eosinophils Absolute 221 15 - 500 cells/uL   Basophils Absolute 57 0 - 200 cells/uL   Neutrophils Relative % 56.7 %   Total Lymphocyte 31.8 %   Monocytes Relative 7.1 %   Eosinophils Relative 3.5 %   Basophils Relative 0.9 %  Hemoglobin A1c  Result Value Ref Range   Hgb A1c MFr Bld 6.8 (H) <5.7 % of total Hgb   Mean Plasma Glucose 148 mg/dL   eAG (mmol/L) 8.2 mmol/L      Assessment & Plan:   Problem List Items Addressed This Visit     Type 2 diabetes mellitus with other specified complication (HCC)    Dramatic improved A1c to 6.8, weight loss on GLP1 Complications - hyperlipidemia, GERD,  morbid obesity, - increases risk of future cardiovascular complications   Plan:  Continue Rybelsus 7 mg daily as discussed, she plans to inc dose to 32m in Jan/Feb 2023 and reduce metformin - Metformin XR 5050mx 1 lunch / 2 supper - HOLD AM Breakfast dose. In future may skip lunch too  2. Encourage improved lifestyle - low carb, low sugar diet, reduce portion size, continue improving regular exercise 3. Check CBG , bring log to next visit for review 4. Continue ACEi 5 DM Foot      Relevant Medications   RYBELSUS 14 MG TABS   metFORMIN (GLUCOPHAGE-XR) 500 MG 24 hr tablet   lisinopril (ZESTRIL) 10 MG tablet   pantoprazole (PROTONIX) 40 MG tablet   Morbid obesity with BMI of 45.0-49.9, adult (HCC)    Significant weight loss on GLP1 Down 27 lbs Improving lifestyle exercise      Relevant Medications   RYBELSUS 14 MG TABS   metFORMIN (GLUCOPHAGE-XR) 500 MG 24 hr tablet   Hyperlipidemia associated with type 2 diabetes mellitus (HCC)    Controlled cholesterol The 10-year ASCVD risk score (Arnett DK, et al., 2019) is: 24.5%  Plan: 1. Offered statin therapy. She declines today due to concerns regarding side effects despite counseling on benefits. 2. Encourage improved lifestyle - low carb/cholesterol, reduce portion size, continue improving regular exercise       Relevant Medications    RYBELSUS 14 MG TABS   metFORMIN (GLUCOPHAGE-XR) 500 MG 24 hr tablet   lisinopril (ZESTRIL) 10 MG tablet   Other Visit Diagnoses     Annual physical exam    -  Primary   Gastroesophageal reflux disease, unspecified whether esophagitis present       Relevant Medications   pantoprazole (PROTONIX) 40 MG tablet   Osteopenia of multiple sites       Relevant Orders   DG Bone Density       Updated Health Maintenance information Updated vaccines Reviewed recent lab results with patient Encouraged improvement to lifestyle with diet and exercise Goal of weight loss  Orders Placed This Encounter  Procedures   DG Bone Density    Standing Status:   Future    Standing Expiration Date:  05/08/2022    Order Specific Question:   Reason for Exam (SYMPTOM  OR DIAGNOSIS REQUIRED)    Answer:   osteopenia multiple sites    Order Specific Question:   Preferred imaging location?    Answer:   [facility] ordered this encounter  Medications   lisinopril (ZESTRIL) 10 MG tablet    Sig: Take 1 tablet (10 mg total) by mouth daily.    Dispense:  90 tablet    Refill:  3   pantoprazole (PROTONIX) 40 MG tablet    Sig: Take 1 tablet (40 mg total) by mouth daily before breakfast.    Dispense:  90 tablet    Refill:  3       Follow up plan: Return in about 3 months (around 08/08/2021) for 3 month follow up DM A1c med adjust, HTN.  Nobie Putnam, [facility] 05/08/2021, 8:10 AM

## 2021-05-08 NOTE — Assessment & Plan Note (Signed)
Significant weight loss on GLP1 Down 27 lbs Improving lifestyle exercise

## 2021-05-11 ENCOUNTER — Ambulatory Visit
Admission: RE | Admit: 2021-05-11 | Discharge: 2021-05-11 | Disposition: A | Discharge status: Home / Self Care | Payer: Medicare HMO | Source: Ambulatory Visit | Attending: Family Medicine | Admitting: Family Medicine

## 2021-05-11 ENCOUNTER — Other Ambulatory Visit: Payer: Self-pay

## 2021-05-11 DIAGNOSIS — M8589 Other specified disorders of bone density and structure, multiple sites: Secondary | ICD-10-CM | POA: Diagnosis present

## 2021-07-05 ENCOUNTER — Other Ambulatory Visit: Payer: Self-pay | Admitting: Family Medicine

## 2021-07-05 DIAGNOSIS — E1169 Type 2 diabetes mellitus with other specified complication: Secondary | ICD-10-CM

## 2021-07-05 NOTE — Telephone Encounter (Signed)
Medication Refill - Medication:  RYBELSUS 14 MG TABS   Has the patient contacted their pharmacy? Yes.   Contact PCP  Preferred Pharmacy (with phone number or street name):  [facility], [place] Phone:  [phone]  Fax:  [phone]     Has the patient been seen for an appointment in the last year OR does the patient have an upcoming appointment? Yes.    Agent: Please be advised that RX refills may take up to 3 business days. We ask that you follow-up with your pharmacy.

## 2021-07-06 MED ORDER — RYBELSUS 14 MG PO TABS: 14.0000 mg | ORAL_TABLET | Freq: Every day | ORAL | 3 refills | Status: DC

## 2021-07-06 NOTE — Telephone Encounter (Signed)
Requested medications are due for refill today.  unsure  Requested medications are on the active medications list.  yes  Last refill. 05/08/2021  Future visit scheduled.   yes  Notes to clinic.  No protocol assigned to this medication.    Requested Prescriptions  Pending Prescriptions Disp Refills   RYBELSUS 14 MG TABS 90 tablet 3    Sig: Take 14 mg by mouth daily.     Off-Protocol Failed - 07/05/2021  2:14 PM      Failed - Medication not assigned to a protocol, review manually.      Passed - Valid encounter within last 12 months    Recent Outpatient Visits           1 month ago Annual physical exam   [place], DO   7 months ago Type 2 diabetes mellitus with other specified complication, without long-term current use of insulin [facility])   [facility], Devonne Doughty, DO   10 months ago Type 2 diabetes mellitus with other specified complication, without long-term current use of insulin ([place])   [facility], Devonne Doughty, DO   1 year ago Type 2 diabetes mellitus with other specified complication, without long-term current use of insulin [facility])   [facility] Olin Hauser, DO   1 year ago Type 2 diabetes mellitus with other specified complication, without long-term current use of insulin ([place])   [facility] [place], Devonne Doughty, DO       Future Appointments             In 1 month [place] [facility], Devonne Doughty, DO [facility], [facility]

## 2021-08-10 ENCOUNTER — Encounter: Payer: Self-pay | Admitting: Family Medicine

## 2021-08-10 ENCOUNTER — Ambulatory Visit (INDEPENDENT_AMBULATORY_CARE_PROVIDER_SITE_OTHER): Payer: Medicare HMO | Admitting: Family Medicine

## 2021-08-10 ENCOUNTER — Other Ambulatory Visit: Payer: Self-pay

## 2021-08-10 VITALS — BP 138/75 | HR 73 | Ht 59.0 in | Wt 220.6 lb

## 2021-08-10 DIAGNOSIS — E1169 Type 2 diabetes mellitus with other specified complication: Secondary | ICD-10-CM | POA: Diagnosis not present

## 2021-08-10 DIAGNOSIS — Z6841 Body Mass Index (BMI) 40.0 and over, adult: Secondary | ICD-10-CM

## 2021-08-10 DIAGNOSIS — Z634 Disappearance and death of family member: Secondary | ICD-10-CM

## 2021-08-10 DIAGNOSIS — R030 Elevated blood-pressure reading, without diagnosis of hypertension: Secondary | ICD-10-CM

## 2021-08-10 DIAGNOSIS — M170 Bilateral primary osteoarthritis of knee: Secondary | ICD-10-CM

## 2021-08-10 LAB — POCT GLYCOSYLATED HEMOGLOBIN (HGB A1C): Hemoglobin A1C: 6.7 % — AB (ref 4.0–5.6)

## 2021-08-10 NOTE — Assessment & Plan Note (Signed)
Continues significant improved weight Down 3-4 lbs in past 3 months Continues on oral GLP1 with good results Continues improved lifestyle

## 2021-08-10 NOTE — Patient Instructions (Addendum)
Thank you for coming to the office today.  Reduce Metformin down to 2 pills with dinner only Keep on Rybelsus 14mg  daily Keep up the great work  Recent Labs    12/05/20 0856 05/02/21 0848 08/10/21 0825  HGBA1C 7.2* 6.8* 6.7*   Prevnar 20 - pneumonia shot due next time, only dose you'll need. No further repeat.   Please schedule a Follow-up Appointment to: Return in about 3 months (around 11/07/2021) for 3 month follow DM A1c.  If you have any other questions or concerns, please feel free to call the office or send a message through [place]. You may also schedule an earlier appointment if necessary.  Additionally, you may be receiving a survey about your experience at our office within a few days to 1 week by e-mail or mail. We value your feedback.  Nobie Putnam, DO [place]

## 2021-08-10 NOTE — Assessment & Plan Note (Signed)
Dramatic improved A1c to 6.7 today, continued weight loss on GLP1 Complications - hyperlipidemia, GERD,  morbid obesity, - increases risk of future cardiovascular complications   Plan:  Continue Rybelsus 14 mg daily (newly increased in early 2023) REDUCE Metformin XR 500mg  x 2 with dinner only (instead of x 3 pills per day)  2. Encourage improved lifestyle - low carb, low sugar diet, reduce portion size, continue improving regular exercise 3. Check CBG , bring log to next visit for review 4. Continue ACEi

## 2021-08-10 NOTE — Progress Notes (Signed)
Subjective:    Patient ID: Tina Carey, female    DOB: 1950/08/15, 71 y.o.   MRN: [id number]  Tina Carey is a 71 y.o. female presenting on 08/10/2021 for Diabetes and Hypertension   HPI  CHRONIC DM, Type 2 Morbid Obesity BMI >44   No alcohol since 1993 fam history brother/sister, paternal grandmother with diabetes   Update today, she has increased to Rybelsus 14mg  daily recently, has done well. She described some constipation she developed recently and treated with stool softener OTC with good results. She had some mild rash on elbow and used cortisone topical with relief.  She admits difficulty with holidays and had some limited diet adherence but she has done fairly well, trying to avoid sweets. Due for A1c, previously 6.8 CBGs: Avg 104-150 Meds: Metformin XR 500mg  1 afternoon / 2 PM, Rybelsus 14mg  daily - She is asking about adjusting her Metformin. Too strong has some rare low sugars. Reports good compliance. Tolerating well w/o side-effects Currently on ACEi Lifestyle: Weight loss 18 lbs in 9 months, and 3 lbs in past 3 months. - Diet (avoiding sweets, avoiding eating late PM after 7, she does eat healthy snacks and some reduced portions, and still occasional potato chips on weekend she enjoys) - Exercise (walking regularly, biking - regularly daily, several days weekly, enjoys reading while riding) Denies hypoglycemia, polyuria, visual changes, numbness or tingling.   Bilateral Knee Osteoarthritis  Left Leg / Knee Cramps Following started Rybelsus 14mg  recently. 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 She took Tylenol arthritis and it helped when she woke up. Describes some episodic pain in knees.    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 She has some good days and bad days. She has support system with family, daughters, grand children. She does not admit actual clinical depression. She declines  medication She has gradually processed her grieving    Health Maintenance:  Due Prevnar20 next time   Colonoscopy 04/08/17 [facility] Dr Epimenio Foot - 5 polyp, adenoma, rpt 5 year due 04/2022   Updated COVID19 booster shot 04/2021 Updated Flu Shot 03/2021  Depression screen [facility] 2/9 08/10/2021 05/08/2021 12/05/2020  Decreased Interest 0 1 0  Down, Depressed, Hopeless 0 1 0  PHQ - 2 Score 0 2 0  Altered sleeping 0 1 0  Tired, decreased energy 0 1 0  Change in appetite 1 1 1   Feeling bad or failure about yourself  0 0 0  Trouble concentrating 0 1 0  Moving slowly or fidgety/restless 0 1 0  Suicidal thoughts 0 0 0  PHQ-9 Score 1 7 1   Difficult doing work/chores Not difficult at all Somewhat difficult Not difficult at all   GAD 7 : Generalized Anxiety Score 08/10/2021 05/08/2021 12/05/2020  Nervous, Anxious, on Edge 0 1 0  Control/stop worrying 0 1 0  Worry too much - different things 0 1 0  Trouble relaxing 1 1 0  Restless 0 1 0  Easily annoyed or irritable 1 1 0  Afraid - awful might happen 1 1 0  Total GAD 7 Score 3 7 0  Anxiety Difficulty Not difficult at all Not difficult at all Not difficult at all      Social History   Tobacco Use   Smoking status: Never   Smokeless tobacco: Never  Vaping Use   Vaping Use: Never used  Substance Use Topics   Alcohol use: No  Drug use: Never    Review of Systems Per HPI unless specifically indicated above     Objective:    BP 138/75 (BP Location: Left Arm, Cuff Size: Normal)    Pulse 73    Ht 4\' 11"  (1.499 m)    Wt 220 lb 9.6 oz (100.1 kg)    SpO2 100%    BMI 44.56 kg/m   Wt Readings from Last 3 Encounters:  08/10/21 220 lb 9.6 oz (100.1 kg)  05/08/21 223 lb 12.8 oz (101.5 kg)  12/05/20 238 lb (108 kg)    Physical Exam Vitals and nursing note reviewed.  Constitutional:      General: She is not in acute distress.    Appearance: She is well-developed. She is obese. She is not diaphoretic.     Comments: Well-appearing,  comfortable, cooperative  HENT:     Head: Normocephalic and atraumatic.  Eyes:     General:        Right eye: No discharge.        Left eye: No discharge.     Conjunctiva/sclera: Conjunctivae normal.  Neck:     Thyroid: No thyromegaly.  Cardiovascular:     Rate and Rhythm: Normal rate and regular rhythm.     Heart sounds: Normal heart sounds. No murmur heard. Pulmonary:     Effort: Pulmonary effort is normal. No respiratory distress.     Breath sounds: Normal breath sounds. No wheezing or rales.  Musculoskeletal:        General: Normal range of motion.     Cervical back: Normal range of motion and neck supple.  Lymphadenopathy:     Cervical: No cervical adenopathy.  Skin:    General: Skin is warm and dry.     Findings: No erythema or rash.  Neurological:     Mental Status: She is alert and oriented to person, place, and time.  Psychiatric:        Behavior: Behavior normal.     Comments: Well groomed, good eye contact, normal speech and thoughts     Results for orders placed or performed in visit on 08/10/21  POCT HgB A1C  Result Value Ref Range   Hemoglobin A1C 6.7 (A) 4.0 - 5.6 %   Recent Labs    12/05/20 0856 05/02/21 0848 08/10/21 0825  HGBA1C 7.2* 6.8* 6.7*       Assessment & Plan:   Problem List Items Addressed This Visit     Type 2 diabetes mellitus with other specified complication ([place]) - Primary    Dramatic improved A1c to 6.7 today, continued weight loss on GLP1 Complications - hyperlipidemia, GERD,  morbid obesity, - increases risk of future cardiovascular complications   Plan:  Continue Rybelsus 14 mg daily (newly increased in early 2023) REDUCE Metformin XR 500mg  x 2 with dinner only (instead of x 3 pills per day)  2. Encourage improved lifestyle - low carb, low sugar diet, reduce portion size, continue improving regular exercise 3. Check CBG , bring log to next visit for review 4. Continue ACEi      Relevant Orders   POCT HgB A1C  (Completed)   Osteoarthritis of knees, bilateral    Bilateral knee pain, episodic flares Improved w/ Tylenol and activity      Morbid obesity with BMI of 40.0-44.9, adult (HCC)    Continues significant improved weight Down 3-4 lbs in past 3 months Continues on oral GLP1 with good results Continues improved lifestyle      Other  Visit Diagnoses     Bereavement       Elevated BP without diagnosis of hypertension           #Bereavement, adjustment disorder Significantly improved. No further depressed mood w adjustment She has normal bereavement still family helping  Due for Prevnar 20 next time  Elevated BP, repeat improved.  No orders of the defined types were placed in this encounter.    Follow up plan: Return in about 3 months (around 11/07/2021) for 3 month follow DM A1c.   Nobie Putnam, [facility] 08/10/2021, 8:20 AM

## 2021-08-10 NOTE — Assessment & Plan Note (Signed)
Bilateral knee pain, episodic flares Improved w/ Tylenol and activity

## 2021-09-01 LAB — HM DIABETES EYE EXAM

## 2021-09-11 ENCOUNTER — Other Ambulatory Visit: Payer: Self-pay | Admitting: Family Medicine

## 2021-09-11 DIAGNOSIS — Z1231 Encounter for screening mammogram for malignant neoplasm of breast: Secondary | ICD-10-CM

## 2021-10-18 ENCOUNTER — Ambulatory Visit
Admission: RE | Admit: 2021-10-18 | Discharge: 2021-10-18 | Disposition: A | Discharge status: Home / Self Care | Payer: Medicare HMO | Source: Ambulatory Visit | Attending: Family Medicine | Admitting: Family Medicine

## 2021-10-18 DIAGNOSIS — Z1231 Encounter for screening mammogram for malignant neoplasm of breast: Secondary | ICD-10-CM | POA: Insufficient documentation

## 2021-11-10 ENCOUNTER — Encounter: Payer: Self-pay | Admitting: Family Medicine

## 2021-11-10 ENCOUNTER — Other Ambulatory Visit: Payer: Self-pay | Admitting: Family Medicine

## 2021-11-10 ENCOUNTER — Ambulatory Visit: Payer: Medicare HMO | Admitting: Family Medicine

## 2021-11-10 VITALS — BP 124/76 | HR 78 | Ht 59.0 in | Wt 222.8 lb

## 2021-11-10 DIAGNOSIS — Z6841 Body Mass Index (BMI) 40.0 and over, adult: Secondary | ICD-10-CM

## 2021-11-10 DIAGNOSIS — E1169 Type 2 diabetes mellitus with other specified complication: Secondary | ICD-10-CM

## 2021-11-10 DIAGNOSIS — Z0000 Encounter for general adult medical examination without abnormal findings: Secondary | ICD-10-CM

## 2021-11-10 LAB — POCT GLYCOSYLATED HEMOGLOBIN (HGB A1C): Hemoglobin A1C: 6.7 % — AB (ref 4.0–5.6)

## 2021-11-10 NOTE — Assessment & Plan Note (Signed)
Dramatic improved A1c to , continued weight loss on GLP1 ?Complications - hyperlipidemia, GERD,  morbid obesity, - increases risk of future cardiovascular complications  ? ?Plan:  ?Continue Rybelsus 14 mg daily has refills ?Continue Metformin XR '500mg'$  x 2 with dinner only ? ?2. Encourage improved lifestyle - low carb, low sugar diet, reduce portion size, continue improving regular exercise ?3. Check CBG , bring log to next visit for review ?4. Continue ACEi ?

## 2021-11-10 NOTE — Patient Instructions (Addendum)
Thank you for coming to the office today. ? ?Keep up the great work! ? ?Recent Labs  ?  05/02/21 ?8315 08/10/21 ?0825 11/10/21 ?1761  ?HGBA1C 6.8* 6.7* 6.7*  ? ?BP reading today is good. ? ?Return next week for Prevnar20 Pneumonia vaccine, Nurse Visit apt . ? ?DUE for FASTING BLOOD WORK (no food or drink after midnight before the lab appointment, only water or coffee without cream/sugar on the morning of) ? ?SCHEDULE "Lab Only" visit in the morning at the clinic for lab draw in 6 MONTHS  ? ?- Make sure Lab Only appointment is at about 1 week before your next appointment, so that results will be available ? ?For Lab Results, once available within 2-3 days of blood draw, you can can log in to MyChart online to view your results and a brief explanation. Also, we can discuss results at next follow-up visit. ? ? ?Please schedule a Follow-up Appointment to: Return in about 6 months (around 05/13/2022) for Next week Nurse Visit Vaccine Prevnar20 - 6 month fasting lab, 1 week later Annual (Dm Foot/Urine). ? ?If you have any other questions or concerns, please feel free to call the office or send a message through [place]. You may also schedule an earlier appointment if necessary. ? ?Additionally, you may be receiving a survey about your experience at our office within a few days to 1 week by e-mail or mail. We value your feedback. ? ?Nobie Putnam, DO ?[place] ?

## 2021-11-10 NOTE — Progress Notes (Signed)
? ?Subjective:  ? ? Patient ID: Tina Carey, female    DOB: 1951/04/11, 71 y.o.   MRN: [id number] ? ?Tina Carey is a 71 y.o. female presenting on 11/10/2021 for Diabetes ? ? ?HPI ? ?CHRONIC DM, Type 2 ?Morbid Obesity BMI >45 ?  ?No alcohol since 1993 ?fam history brother/sister, paternal grandmother with diabetes ? ?Updates significant improvement with exercise at gym 3 hours most days in past 3 months, she has lost waist size and overall doing well. ?  ?Due for A1c today ?CBGs: Avg 104-150 ?Meds: Metformin XR '500mg'$  x 2 = '1000mg'$  with dinner, Rybelsus '14mg'$  daily ?Reports good compliance. Tolerating well w/o side-effects ?Currently on ACEi ?Admits some constipation on Rybelsus, taking stool softener AM and PM ?Lifestyle: ?Weight stable with some inc muscle mass with gym, and she has lost waist size ?- Diet (avoiding sweets, avoiding eating late PM after 7, she does eat healthy snacks and some reduced portions, and still occasional potato chips on weekend she enjoys) ?- Exercise (walking regularly, biking - regularly daily, several days weekly, enjoys reading while riding) ?Denies hypoglycemia, polyuria, visual changes, numbness or tingling. ? ? ?Health Maintenance: ?Due Prevnar20 pneumonia vaccine - will return next week for Nurse Visit. ? ? ?  11/10/2021  ?  8:22 AM 08/10/2021  ?  8:20 AM 05/08/2021  ?  8:03 AM  ?Depression screen PHQ 2/9  ?Decreased Interest 0 0 1  ?Down, Depressed, Hopeless 0 0 1  ?PHQ - 2 Score 0 0 2  ?Altered sleeping 0 0 1  ?Tired, decreased energy 0 0 1  ?Change in appetite 0 1 1  ?Feeling bad or failure about yourself  0 0 0  ?Trouble concentrating 0 0 1  ?Moving slowly or fidgety/restless 0 0 1  ?Suicidal thoughts 0 0 0  ?PHQ-9 Score 0 1 7  ?Difficult doing work/chores Not difficult at all Not difficult at all Somewhat difficult  ? ? ?Social History  ? ?Tobacco Use  ? Smoking status: Never  ? Smokeless tobacco: Never  ?Vaping Use  ? Vaping Use: Never used  ?Substance Use Topics  ? Alcohol  use: No  ? Drug use: Never  ? ? ?Review of Systems ?Per HPI unless specifically indicated above ? ?   ?Objective:  ?  ?BP 124/76   Pulse 78   Ht '4\' 11"'$  (1.499 m)   Wt 222 lb 12.8 oz (101.1 kg)   SpO2 100%   BMI 45.00 kg/m?   ?Wt Readings from Last 3 Encounters:  ?11/10/21 222 lb 12.8 oz (101.1 kg)  ?08/10/21 220 lb 9.6 oz (100.1 kg)  ?05/08/21 223 lb 12.8 oz (101.5 kg)  ?  ?Physical Exam ?Vitals and nursing note reviewed.  ?Constitutional:   ?   General: She is not in acute distress. ?   Appearance: Normal appearance. She is well-developed. She is obese. She is not diaphoretic.  ?   Comments: Well-appearing, comfortable, cooperative  ?HENT:  ?   Head: Normocephalic and atraumatic.  ?Eyes:  ?   General:     ?   Right eye: No discharge.     ?   Left eye: No discharge.  ?   Conjunctiva/sclera: Conjunctivae normal.  ?Cardiovascular:  ?   Rate and Rhythm: Normal rate.  ?Pulmonary:  ?   Effort: Pulmonary effort is normal.  ?Skin: ?   General: Skin is warm and dry.  ?   Findings: No erythema or rash.  ?Neurological:  ?   Mental  Status: She is alert and oriented to person, place, and time.  ?Psychiatric:     ?   Mood and Affect: Mood normal.     ?   Behavior: Behavior normal.     ?   Thought Content: Thought content normal.  ?   Comments: Well groomed, good eye contact, normal speech and thoughts  ? ?Results for orders placed or performed in visit on 11/10/21  ?POCT glycosylated hemoglobin (Hb A1C)  ?Result Value Ref Range  ? Hemoglobin A1C 6.7 (A) 4.0 - 5.6 %  ? ?   ?Assessment & Plan:  ? ?Problem List Items Addressed This Visit   ? ? Type 2 diabetes mellitus with other specified complication ([place]) - Primary  ?  Dramatic improved A1c to , continued weight loss on GLP1 ?Complications - hyperlipidemia, GERD,  morbid obesity, - increases risk of future cardiovascular complications  ? ?Plan:  ?Continue Rybelsus 14 mg daily has refills ?Continue Metformin XR '500mg'$  x 2 with dinner only ? ?2. Encourage improved lifestyle -  low carb, low sugar diet, reduce portion size, continue improving regular exercise ?3. Check CBG , bring log to next visit for review ?4. Continue ACEi ?  ?  ? Relevant Orders  ? POCT glycosylated hemoglobin (Hb A1C) (Completed)  ? Morbid obesity with BMI of 45.0-49.9, adult ([place])  ?  Stable weight ?Waist size down ?Muscle mass inc w gym exercise ?Continues on oral GLP1 with good results ?Continues improved lifestyle ? ?  ?  ?  ?No orders of the defined types were placed in this encounter. ? ? ? ?Follow up plan: ?Return in about 6 months (around 05/13/2022) for Next week Nurse Visit Vaccine Prevnar20 - 6 month fasting lab, 1 week later Annual (Dm Foot/Urine). ? ?Future labs ordered for 05/2022 A1c CMET CBC Lipid TSH ? ? ?Nobie Putnam, DO ?[facility] ?[facility] ?11/10/2021, 8:15 AM ?

## 2021-11-10 NOTE — Assessment & Plan Note (Signed)
Stable weight ?Waist size down ?Muscle mass inc w gym exercise ?Continues on oral GLP1 with good results ?Continues improved lifestyle ?

## 2021-11-17 ENCOUNTER — Ambulatory Visit: Payer: Medicare HMO

## 2021-11-17 ENCOUNTER — Ambulatory Visit (INDEPENDENT_AMBULATORY_CARE_PROVIDER_SITE_OTHER): Payer: Medicare HMO

## 2021-11-17 VITALS — Ht 59.0 in | Wt 222.0 lb

## 2021-11-17 DIAGNOSIS — Z23 Encounter for immunization: Secondary | ICD-10-CM

## 2022-02-08 ENCOUNTER — Other Ambulatory Visit: Payer: Self-pay | Admitting: Family Medicine

## 2022-02-08 DIAGNOSIS — E1169 Type 2 diabetes mellitus with other specified complication: Secondary | ICD-10-CM

## 2022-02-08 NOTE — Telephone Encounter (Signed)
Requested Prescriptions  Pending Prescriptions Disp Refills  . metFORMIN (GLUCOPHAGE-XR) 500 MG 24 hr tablet [Pharmacy Med Name: METFORMIN HCL ER TABS 500MG] 360 tablet 1    Sig: TAKE 1 TABLET IN THE MORNING, 1 TABLET AT LUNCH AND 2 TABLETS AT SUPPER     Endocrinology:  Diabetes - Biguanides Failed - 02/08/2022  1:09 AM      Failed - B12 Level in normal range and within 720 days    Vitamin B-12  Date Value Ref Range Status  01/09/2016 >2,000  Final         Passed - Cr in normal range and within 360 days    Creat  Date Value Ref Range Status  05/02/2021 0.79 0.50 - 1.05 mg/dL Final         Passed - HBA1C is between 0 and 7.9 and within 180 days    Hemoglobin A1C  Date Value Ref Range Status  11/10/2021 6.7 (A) 4.0 - 5.6 % Final  09/19/2018 6.5  Final   Hgb A1c MFr Bld  Date Value Ref Range Status  05/02/2021 6.8 (H) <5.7 % of total Hgb Final    Comment:    For someone without known diabetes, a hemoglobin A1c value of 6.5% or greater indicates that they may have  diabetes and this should be confirmed with a follow-up  test. . For someone with known diabetes, a value <7% indicates  that their diabetes is well controlled and a value  greater than or equal to 7% indicates suboptimal  control. A1c targets should be individualized based on  duration of diabetes, age, comorbid conditions, and  other considerations. . Currently, no consensus exists regarding use of hemoglobin A1c for diagnosis of diabetes for children. .          Passed - eGFR in normal range and within 360 days    GFR, Est African American  Date Value Ref Range Status  05/17/2020 72 > OR = 60 mL/min/1.23m Final   GFR, Est Non African American  Date Value Ref Range Status  05/17/2020 62 > OR = 60 mL/min/1.731mFinal   eGFR  Date Value Ref Range Status  05/02/2021 81 > OR = 60 mL/min/1.7341minal    Comment:    The eGFR is based on the CKD-EPI 2021 equation. To calculate  the new eGFR from a  previous Creatinine or Cystatin C result, go to https://www.kidney.org/professionals/ kdoqi/gfr%5Fcalculator          Passed - Valid encounter within last 6 months    Recent Outpatient Visits          3 months ago Type 2 diabetes mellitus with other specified complication, without long-term current use of insulin ([facility]   6 months ago Type 2 diabetes mellitus with other specified complication, without long-term current use of insulin ([facility]   9 months ago Annual physical exam   [facility]   1 year ago Type 2 diabetes mellitus with other specified complication, without long-term current use of insulin ([facility]   1 year ago Type 2 diabetes mellitus with other specified complication, without long-term current use of insulin ([facility]      Future Appointments  In 3 months Karamalegos, Devonne Doughty, DO [facility] [place] within normal limits and completed in the last 12 months    WBC  Date Value Ref Range Status  05/02/2021 6.3 3.8 - 10.8 Thousand/uL Final   RBC  Date Value Ref Range Status  05/02/2021 4.34 3.80 - 5.10 Million/uL Final   Hemoglobin  Date Value Ref Range Status  05/02/2021 12.5 11.7 - 15.5 g/dL Final   HCT  Date Value Ref Range Status  05/02/2021 39.4 35.0 - 45.0 % Final   MCHC  Date Value Ref Range Status  05/02/2021 31.7 (L) 32.0 - 36.0 g/dL Final   [facility]  Date Value Ref Range Status  05/02/2021 28.8 27.0 - 33.0 pg Final   MCV  Date Value Ref Range Status  05/02/2021 90.8 80.0 - 100.0 fL Final   No results found for: "PLTCOUNTKUC", "LABPLAT", "POCPLA" RDW  Date Value Ref Range Status  05/02/2021 13.1 11.0 - 15.0 % Final

## 2022-03-13 LAB — HM DIABETES EYE EXAM

## 2022-05-03 ENCOUNTER — Other Ambulatory Visit: Payer: Self-pay | Admitting: Family Medicine

## 2022-05-03 DIAGNOSIS — K219 Gastro-esophageal reflux disease without esophagitis: Secondary | ICD-10-CM

## 2022-05-03 DIAGNOSIS — E1169 Type 2 diabetes mellitus with other specified complication: Secondary | ICD-10-CM

## 2022-05-03 NOTE — Telephone Encounter (Signed)
Requested Prescriptions  Pending Prescriptions Disp Refills   pantoprazole (PROTONIX) 40 MG tablet [Pharmacy Med Name: PANTOPRAZOLE SODIUM DR TABS '40MG'$ ] 90 tablet 2    Sig: TAKE 1 TABLET DAILY BEFORE BREAKFAST     Gastroenterology: Proton Pump Inhibitors Passed - 05/03/2022  1:08 AM      Passed - Valid encounter within last 12 months    Recent Outpatient Visits           5 months ago Type 2 diabetes mellitus with other specified complication, without long-term current use of insulin ([place])   [place], DO   8 months ago Type 2 diabetes mellitus with other specified complication, without long-term current use of insulin ([place])   [facility], Devonne Doughty, DO   12 months ago Annual physical exam   [place], Devonne Doughty, DO   1 year ago Type 2 diabetes mellitus with other specified complication, without long-term current use of insulin ([place])   [facility] [place], Devonne Doughty, DO   1 year ago Type 2 diabetes mellitus with other specified complication, without long-term current use of insulin ([place])   [facility] [place], Devonne Doughty, DO       Future Appointments             In 2 weeks [place], Devonne Doughty, DO [facility]             lisinopril (ZESTRIL) 10 MG tablet [Pharmacy Med Name: LISINOPRIL TABS '10MG'$ ] 90 tablet 0    Sig: TAKE 1 TABLET DAILY     Cardiovascular:  ACE Inhibitors Failed - 05/03/2022  1:08 AM      Failed - Cr in normal range and within 180 days    Creat  Date Value Ref Range Status  05/02/2021 0.79 0.50 - 1.05 mg/dL Final         Failed - K in normal range and within 180 days    Potassium  Date Value Ref Range Status  05/02/2021 4.6 3.5 - 5.3 mmol/L Final         Passed - Patient is not pregnant      Passed - Last BP in normal range    BP Readings from Last 1 Encounters:  11/10/21 124/76          Passed - Valid encounter within last 6 months    Recent Outpatient Visits           5 months ago Type 2 diabetes mellitus with other specified complication, without long-term current use of insulin ([place])   [place], DO   8 months ago Type 2 diabetes mellitus with other specified complication, without long-term current use of insulin ([place])   [facility], Devonne Doughty, DO   12 months ago Annual physical exam   [facility] [place], Devonne Doughty, DO   1 year ago Type 2 diabetes mellitus with other specified complication, without long-term current use of insulin [facility])   [facility] [place], Devonne Doughty, DO   1 year ago Type 2 diabetes mellitus with other specified complication, without long-term current use of insulin ([place])   [facility] [place], Devonne Doughty, DO       Future Appointments             In 2 weeks [place], Devonne Doughty, DO [facility] [place]

## 2022-05-08 ENCOUNTER — Encounter: Payer: Self-pay | Admitting: Internal Medicine

## 2022-05-10 ENCOUNTER — Other Ambulatory Visit: Payer: Self-pay

## 2022-05-10 DIAGNOSIS — Z0000 Encounter for general adult medical examination without abnormal findings: Secondary | ICD-10-CM

## 2022-05-10 DIAGNOSIS — E1169 Type 2 diabetes mellitus with other specified complication: Secondary | ICD-10-CM

## 2022-05-11 ENCOUNTER — Other Ambulatory Visit: Payer: Medicare HMO

## 2022-05-12 LAB — LIPID PANEL
Cholesterol: 188 mg/dL (ref <200)
HDL: 56 mg/dL (ref >=50)
LDL Cholesterol (Calc): 114 mg/dL (calc) — ABNORMAL HIGH
Non-HDL Cholesterol (Calc): 132 mg/dL (calc) — ABNORMAL HIGH (ref <130)
Total CHOL/HDL Ratio: 3.4 (calc) (ref <5.0)
Triglycerides: 85 mg/dL (ref <150)

## 2022-05-12 LAB — CBC WITH DIFFERENTIAL/PLATELET
Absolute Monocytes: 336 cells/uL (ref 200–950)
Basophils Absolute: 59 cells/uL (ref 0–200)
Basophils Relative: 1 %
Eosinophils Absolute: 171 cells/uL (ref 15–500)
Eosinophils Relative: 2.9 %
HCT: 39.7 % (ref 35.0–45.0)
Hemoglobin: 13.2 g/dL (ref 11.7–15.5)
Lymphs Abs: 2047 cells/uL (ref 850–3900)
MCH: 30.1 pg (ref 27.0–33.0)
MCHC: 33.2 g/dL (ref 32.0–36.0)
MCV: 90.6 fL (ref 80.0–100.0)
MPV: 9.8 fL (ref 7.5–12.5)
Monocytes Relative: 5.7 %
Neutro Abs: 3286 cells/uL (ref 1500–7800)
Neutrophils Relative %: 55.7 %
Platelets: 366 10*3/uL (ref 140–400)
RBC: 4.38 10*6/uL (ref 3.80–5.10)
RDW: 12.5 % (ref 11.0–15.0)
Total Lymphocyte: 34.7 %
WBC: 5.9 10*3/uL (ref 3.8–10.8)

## 2022-05-12 LAB — COMPLETE METABOLIC PANEL WITH GFR
AG Ratio: 1.6 (calc) (ref 1.0–2.5)
ALT: 17 U/L (ref 6–29)
AST: 18 U/L (ref 10–35)
Albumin: 4.3 g/dL (ref 3.6–5.1)
Alkaline phosphatase (APISO): 68 U/L (ref 37–153)
BUN/Creatinine Ratio: 31 (calc) — ABNORMAL HIGH (ref 6–22)
BUN: 26 mg/dL — ABNORMAL HIGH (ref 7–25)
CO2: 28 mmol/L (ref 20–32)
Calcium: 10 mg/dL (ref 8.6–10.4)
Chloride: 102 mmol/L (ref 98–110)
Creat: 0.84 mg/dL (ref 0.50–1.05)
Globulin: 2.7 g/dL (calc) (ref 1.9–3.7)
Glucose, Bld: 138 mg/dL — ABNORMAL HIGH (ref 65–99)
Potassium: 4.2 mmol/L (ref 3.5–5.3)
Sodium: 138 mmol/L (ref 135–146)
Total Bilirubin: 0.4 mg/dL (ref 0.2–1.2)
Total Protein: 7 g/dL (ref 6.1–8.1)
eGFR: 75 mL/min/{1.73_m2} (ref >=60)

## 2022-05-12 LAB — HEMOGLOBIN A1C
Hgb A1c MFr Bld: 7.8 % of total Hgb — ABNORMAL HIGH (ref <5.7)
Mean Plasma Glucose: 177 mg/dL
eAG (mmol/L): 9.8 mmol/L

## 2022-05-12 LAB — TSH: TSH: 0.51 mIU/L (ref 0.40–4.50)

## 2022-05-17 ENCOUNTER — Ambulatory Visit (INDEPENDENT_AMBULATORY_CARE_PROVIDER_SITE_OTHER): Payer: Medicare HMO | Admitting: Family Medicine

## 2022-05-17 ENCOUNTER — Encounter: Payer: Self-pay | Admitting: Family Medicine

## 2022-05-17 VITALS — BP 138/84 | HR 90 | Ht 59.0 in | Wt 228.8 lb

## 2022-05-17 DIAGNOSIS — Z0000 Encounter for general adult medical examination without abnormal findings: Secondary | ICD-10-CM | POA: Diagnosis not present

## 2022-05-17 DIAGNOSIS — E113299 Type 2 diabetes mellitus with mild nonproliferative diabetic retinopathy without macular edema, unspecified eye: Secondary | ICD-10-CM

## 2022-05-17 DIAGNOSIS — E1169 Type 2 diabetes mellitus with other specified complication: Secondary | ICD-10-CM

## 2022-05-17 DIAGNOSIS — Z6841 Body Mass Index (BMI) 40.0 and over, adult: Secondary | ICD-10-CM

## 2022-05-17 DIAGNOSIS — E785 Hyperlipidemia, unspecified: Secondary | ICD-10-CM

## 2022-05-17 NOTE — Assessment & Plan Note (Signed)
Controlled cholesterol The 10-year ASCVD risk score (Arnett DK, et al., 2019) is: 26.1%  Plan: 1. Offered statin therapy. She declines today due to concerns regarding side effects despite counseling on benefits. 2. Encourage improved lifestyle - low carb/cholesterol, reduce portion size, continue improving regular exercise

## 2022-05-17 NOTE — Assessment & Plan Note (Addendum)
Elevated A1c to 7.8 now, previously 6.7 range Some wt gain but has major overhaul lifestyle diet and exercise  Complications - hyperlipidemia, GERD,  morbid obesity, - increases risk of future cardiovascular complications   Plan:  Continue Rybelsus 14 mg daily has refills - As discussed, in future hopefully 2024 we will have available the higher doses 25 or '50mg'$  in future for better weight loss. - Reconsider GIP/GLP1 with [facility], she will reconsider. Continue Metformin XR '500mg'$  x 2 with dinner only  2. Encourage improved lifestyle - low carb, low sugar diet, reduce portion size, continue improving regular exercise 3. Check CBG , bring log to next visit for review 4. Continue ACEi

## 2022-05-17 NOTE — Assessment & Plan Note (Signed)
Secondary to DM

## 2022-05-17 NOTE — Patient Instructions (Addendum)
Thank you for coming to the office today.  Keep on Rybelsus '14mg'$  daily for now As discussed, in future hopefully 2024 we will have available the higher doses 25 or '50mg'$  in future for better weight loss.  Potassium looks good, you can reduce bananas and foods that inc potassium.  In future we can consider Mounjaro injection device if interested.  Keep Turmeric supplement  START anti inflammatory topical - OTC Voltaren (generic Diclofenac) topical 2-4 times a day as needed for pain swelling of affected joint for 1-2 weeks or longer.   Please schedule a Follow-up Appointment to: Return in about 3 months (around 08/17/2022) for 3 month DM A1c, weight, HTN.  If you have any other questions or concerns, please feel free to call the office or send a message through [place]. You may also schedule an earlier appointment if necessary.  Additionally, you may be receiving a survey about your experience at our office within a few days to 1 week by e-mail or mail. We value your feedback.  Nobie Putnam, DO [place]

## 2022-05-17 NOTE — Progress Notes (Signed)
Subjective:    Patient ID: Tina Carey, female    DOB: Oct 09, 1950, 71 y.o.   MRN: [id number]  Tina Carey is a 71 y.o. female presenting on 05/17/2022 for Annual Exam   HPI  Here for Annual Physical and Lab Review.  CHRONIC DM, Type 2 Morbid Obesity BMI >46   No alcohol since 1993 fam history brother/sister, paternal grandmother with diabetes   Updates significant improvement with exercise at gym 3 hours most days in past 3 months, she has lost waist size and overall doing well.   Due for A1c today CBGs: Avg 104-150 Meds: Metformin XR x 2 = 571m 10089mdose in pm with supper, Rybelsus 1465maily Reports good compliance. Tolerating well w/o side-effects Currently on ACEi Admits some constipation on Rybelsus, taking stool softener AM and PM Lifestyle: Weight stable on her home scale but apparently gain 6 lbs on our scale Weight stable with some inc muscle mass with gym, and she has lost waist size - Diet (avoiding sweets, avoiding eating late PM after 7, she does eat healthy snacks and some reduced portions, and still occasional potato chips on weekend she enjoys) Goal to avoid future bagel, raisin toast, banana to improve her diet - Exercise (walking 10k steps per day on machine, gym 6 days a week with up to 2-3 hours of workout) Denies hypoglycemia, polyuria, visual changes, numbness or tingling.  HYPERLIPIDEMIA: - Reports no concerns. Last lipid panel mostly controlled LDL 114. Declines Statin therapy due to risk of side effect    Additional problem  Arthritis / Finger Stiffness Reports both ring fingers, difficulty closing fingers into hand in fist, if she is active and moving and joints are warmed up she can move them better. - Tylenol   Health Maintenance: Due Prevnar20 pneumonia vaccine - will return next week for Nurse Visit.      05/17/2022    1:18 PM 11/10/2021    8:22 AM 08/10/2021    8:20 AM  Depression screen PHQ 2/9  Decreased Interest 0 0 0   Down, Depressed, Hopeless 0 0 0  PHQ - 2 Score 0 0 0  Altered sleeping 0 0 0  Tired, decreased energy 0 0 0  Change in appetite 0 0 1  Feeling bad or failure about yourself  0 0 0  Trouble concentrating 0 0 0  Moving slowly or fidgety/restless 0 0 0  Suicidal thoughts 0 0 0  PHQ-9 Score 0 0 1  Difficult doing work/chores Not difficult at all Not difficult at all Not difficult at all    Past Medical History:  Diagnosis Date   Arthritis    knees   Cancer ([facility]009   sinus   GERD (gastroesophageal reflux disease)    Osteoporosis    Past Surgical History:  Procedure Laterality Date   CATARACT EXTRACTION W/PHACO Left 06/17/2017   Procedure: CATARACT EXTRACTION PHACO AND INTRAOCULAR LENS PLACEMENT (IOCGilt EdgeLEFT DIABETIC;  Surgeon: BraLeandrew KoyanagiD;  [facility];  Laterality: Left;  Diabetic - oral meds Does NOT want to be first   CATARACT EXTRACTION W/PHACO Right 10/23/2017   Procedure: CATARACT EXTRACTION PHACO AND INTRAOCULAR LENS PLACEMENT (IOCHeflinIGHT DIABETIC;  Surgeon: BraLeandrew KoyanagiD;  Location: [facility]igService: Ophthalmology;  Laterality: Right;  diabetic -oral meds   GASTRIC BYPASS     NASAL SINUS SURGERY  2009   Social History   Socioeconomic History   Marital status: Married  Spouse name: Not on file   Number of children: Not on file   Years of education: Not on file   Highest education level: Not on file  Occupational History   Not on file  Tobacco Use   Smoking status: Never   Smokeless tobacco: Never  Vaping Use   Vaping Use: Never used  Substance and Sexual Activity   Alcohol use: No   Drug use: Never   Sexual activity: Not on file  Other Topics Concern   Not on file  Social History Narrative   Not on file   Social Determinants of Health   Financial Resource Strain: Not on file  Food Insecurity: Not on file  Transportation Needs: Not on file  Physical Activity: Not on file   Stress: Not on file  Social Connections: Not on file  Intimate Partner Violence: Not on file   Family History  Problem Relation Age of Onset   Stroke Mother    Diabetes Mother    Diabetes Maternal Grandmother    Diabetes Paternal Grandmother    Diabetes Brother    Breast cancer Neg Hx    Current Outpatient Medications on File Prior to Visit  Medication Sig   calcium carbonate (OS-CAL) 1250 (500 Ca) MG chewable tablet Chew 1 tablet by mouth daily.   Cholecalciferol (VITAMIN D3 PO) Take 200 Units by mouth daily.   Coenzyme Q-10 200 MG CAPS Take 200 mg by mouth daily.   cyanocobalamin 100 MCG tablet Take by mouth.   lisinopril (ZESTRIL) 10 MG tablet TAKE 1 TABLET DAILY   magnesium gluconate (MAGONATE) 500 MG tablet Take 500 mg by mouth 2 (two) times daily.   Multiple Vitamin (MULTIVITAMIN) capsule Take 1 capsule by mouth daily.   pantoprazole (PROTONIX) 40 MG tablet TAKE 1 TABLET DAILY BEFORE BREAKFAST   pyridOXINE (VITAMIN B-6) 50 MG tablet Take 50 mg by mouth daily.   RYBELSUS 14 MG TABS Take 14 mg by mouth daily.   TURMERIC CURCUMIN PO Take by mouth.   metFORMIN (GLUCOPHAGE-XR) 500 MG 24 hr tablet Take 2 tablets (1,000 mg total) by mouth daily with supper.   No current facility-administered medications on file prior to visit.    Review of Systems  Constitutional:  Negative for activity change, appetite change, chills, diaphoresis, fatigue and fever.  HENT:  Negative for congestion and hearing loss.   Eyes:  Negative for visual disturbance.  Respiratory:  Negative for cough, chest tightness, shortness of breath and wheezing.   Cardiovascular:  Negative for chest pain, palpitations and leg swelling.  Gastrointestinal:  Negative for abdominal pain, constipation, diarrhea, nausea and vomiting.  Genitourinary:  Negative for dysuria, frequency and hematuria.  Musculoskeletal:  Negative for arthralgias and neck pain.  Skin:  Negative for rash.  Neurological:  Negative for  dizziness, weakness, light-headedness, numbness and headaches.  Hematological:  Negative for adenopathy.  Psychiatric/Behavioral:  Negative for behavioral problems, dysphoric mood and sleep disturbance.    Per HPI unless specifically indicated above     Objective:    BP 138/84 (BP Location: Left Arm, Cuff Size: Normal)   Pulse 90   Ht _0  (1.499 m)   Wt 228 lb 12.8 oz (103.8 kg)   SpO2 96%   BMI 46.21 kg/m   Wt Readings from Last 3 Encounters:  05/17/22 228 lb 12.8 oz (103.8 kg)  11/17/21 222 lb (100.7 kg)  11/10/21 222 lb 12.8 oz (101.1 kg)    Physical Exam Vitals and nursing note reviewed.  Constitutional:      General: She is not in acute distress.    Appearance: She is well-developed. She is obese. She is not diaphoretic.     Comments: Well-appearing, comfortable, cooperative  HENT:     Head: Normocephalic and atraumatic.  Eyes:     General:        Right eye: No discharge.        Left eye: No discharge.     Conjunctiva/sclera: Conjunctivae normal.     Pupils: Pupils are equal, round, and reactive to light.  Neck:     Thyroid: No thyromegaly.     Vascular: No carotid bruit.  Cardiovascular:     Rate and Rhythm: Normal rate and regular rhythm.     Pulses: Normal pulses.     Heart sounds: Normal heart sounds. No murmur heard. Pulmonary:     Effort: Pulmonary effort is normal. No respiratory distress.     Breath sounds: Normal breath sounds. No wheezing or rales.  Abdominal:     General: Bowel sounds are normal. There is no distension.     Palpations: Abdomen is soft. There is no mass.     Tenderness: There is no abdominal tenderness.  Musculoskeletal:        General: No tenderness. Normal range of motion.     Cervical back: Normal range of motion and neck supple.     Right lower leg: No edema.     Left lower leg: No edema.     Comments: Upper / Lower Extremities: - Normal muscle tone, strength bilateral upper extremities 5/5, lower extremities 5/5   Lymphadenopathy:     Cervical: No cervical adenopathy.  Skin:    General: Skin is warm and dry.     Findings: No erythema or rash.  Neurological:     Mental Status: She is alert and oriented to person, place, and time.     Comments: Distal sensation intact to light touch all extremities  Psychiatric:        Mood and Affect: Mood normal.        Behavior: Behavior normal.        Thought Content: Thought content normal.     Comments: Well groomed, good eye contact, normal speech and thoughts     Recent Labs    08/10/21 0825 11/10/21 0826 05/11/22 0833  HGBA1C 6.7* 6.7* 7.8*    Results for orders placed or performed in visit on 05/10/22  TSH  Result Value Ref Range   TSH 0.51 0.40 - 4.50 mIU/L  Hemoglobin A1c  Result Value Ref Range   Hgb A1c MFr Bld 7.8 (H) <5.7 % of total Hgb   Mean Plasma Glucose 177 mg/dL   eAG (mmol/L) 9.8 mmol/L  Lipid panel  Result Value Ref Range   Cholesterol 188 <200 mg/dL   HDL 56 > OR = 50 mg/dL   Triglycerides 85 <150 mg/dL   LDL Cholesterol (Calc) 114 (H) mg/dL (calc)   Total CHOL/HDL Ratio 3.4 <5.0 (calc)   Non-HDL Cholesterol (Calc) 132 (H) <130 mg/dL (calc)  CBC with Differential/Platelet  Result Value Ref Range   WBC 5.9 3.8 - 10.8 Thousand/uL   RBC 4.38 3.80 - 5.10 Million/uL   Hemoglobin 13.2 11.7 - 15.5 g/dL   HCT 39.7 35.0 - 45.0 %   MCV 90.6 80.0 - 100.0 fL   MCH 30.1 27.0 - 33.0 pg   MCHC 33.2 32.0 - 36.0 g/dL   RDW 12.5 11.0 - 15.0 %  Platelets 366 140 - 400 Thousand/uL   MPV 9.8 7.5 - 12.5 fL   Neutro Abs 3,286 1,500 - 7,800 cells/uL   Lymphs Abs 2,047 850 - 3,900 cells/uL   Absolute Monocytes 336 200 - 950 cells/uL   Eosinophils Absolute 171 15 - 500 cells/uL   Basophils Absolute 59 0 - 200 cells/uL   Neutrophils Relative % 55.7 %   Total Lymphocyte 34.7 %   Monocytes Relative 5.7 %   Eosinophils Relative 2.9 %   Basophils Relative 1.0 %  COMPLETE METABOLIC PANEL WITH GFR  Result Value Ref Range   Glucose, Bld  138 (H) 65 - 99 mg/dL   BUN 26 (H) 7 - 25 mg/dL   Creat 0.84 0.50 - 1.05 mg/dL   eGFR 75 > OR = 60 mL/min/1.38m   BUN/Creatinine Ratio 31 (H) 6 - 22 (calc)   Sodium 138 135 - 146 mmol/L   Potassium 4.2 3.5 - 5.3 mmol/L   Chloride 102 98 - 110 mmol/L   CO2 28 20 - 32 mmol/L   Calcium 10.0 8.6 - 10.4 mg/dL   Total Protein 7.0 6.1 - 8.1 g/dL   Albumin 4.3 3.6 - 5.1 g/dL   Globulin 2.7 1.9 - 3.7 g/dL (calc)   AG Ratio 1.6 1.0 - 2.5 (calc)   Total Bilirubin 0.4 0.2 - 1.2 mg/dL   Alkaline phosphatase (APISO) 68 37 - 153 U/L   AST 18 10 - 35 U/L   ALT 17 6 - 29 U/L      Assessment & Plan:   Problem List Items Addressed This Visit     Hyperlipidemia associated with type 2 diabetes mellitus ([place]    Controlled cholesterol The 10-year ASCVD risk score (Arnett DK, et al., 2019) is: 26.1%  Plan: 1. Offered statin therapy. She declines today due to concerns regarding side effects despite counseling on benefits. 2. Encourage improved lifestyle - low carb/cholesterol, reduce portion size, continue improving regular exercise       Relevant Medications   metFORMIN (GLUCOPHAGE-XR) 500 MG 24 hr tablet   Morbid obesity with BMI of 45.0-49.9, adult (HCC)   Relevant Medications   metFORMIN (GLUCOPHAGE-XR) 500 MG 24 hr tablet   NPDR (nonproliferative diabetic retinopathy) ([place]    Secondary to DM      Relevant Medications   metFORMIN (GLUCOPHAGE-XR) 500 MG 24 hr tablet   Type 2 diabetes mellitus with other specified complication (HCC)    Elevated A1c to 7.8 now, previously 6.7 range Some wt gain but has major overhaul lifestyle diet and exercise  Complications - hyperlipidemia, GERD,  morbid obesity, - increases risk of future cardiovascular complications   Plan:  Continue Rybelsus 14 mg daily has refills - As discussed, in future hopefully 2024 we will have available the higher doses 25 or 566min future for better weight loss. - [facility]  [facility]. Continue Metformin XR 5002m 2 with dinner only  2. Encourage improved lifestyle - low carb, low sugar diet, reduce portion size, continue improving regular exercise 3. Check CBG , bring log to next visit for review 4. Continue ACEi      Relevant Medications   metFORMIN (GLUCOPHAGE-XR) 500 MG 24 hr tablet   Other Visit Diagnoses     Annual physical exam    -  Primary       Updated Health Maintenance information Reviewed recent lab results with patient Encouraged improvement to lifestyle with diet and exercise Goal of weight loss  Potassium looks good, you can reduce bananas and foods that inc potassium.  Keep Turmeric supplement  START anti inflammatory topical - OTC Voltaren (generic Diclofenac) topical 2-4 times a day as needed for pain swelling of affected joint for 1-2 weeks or longer.  No orders of the defined types were placed in this encounter.     Follow up plan: Return in about 3 months (around 08/17/2022) for 3 month DM A1c, weight, HTN.  Nobie Putnam, [facility] 05/17/2022, 1:29 PM

## 2022-05-18 ENCOUNTER — Encounter: Payer: Medicare HMO | Admitting: Family Medicine

## 2022-06-11 ENCOUNTER — Other Ambulatory Visit: Payer: Self-pay | Admitting: Family Medicine

## 2022-06-11 DIAGNOSIS — E1169 Type 2 diabetes mellitus with other specified complication: Secondary | ICD-10-CM

## 2022-06-11 NOTE — Telephone Encounter (Signed)
Medication Refill - Medication: RYBELSUS 14 MG TABS   Has the patient contacted their pharmacy? Yes.   (Agent: If no, request that the patient contact the pharmacy for the refill. If patient does not wish to contact the pharmacy document the reason why and proceed with request.) (Agent: If yes, when and what did the pharmacy advise?)  Preferred Pharmacy (with phone number or street name):  [facility], [address]  [place] [address]  Phone: [phone] Fax: [phone]   Has the patient been seen for an appointment in the last year OR does the patient have an upcoming appointment? Yes.    Agent: Please be advised that RX refills may take up to 3 business days. We ask that you follow-up with your pharmacy.

## 2022-06-12 MED ORDER — RYBELSUS 14 MG PO TABS: 14.0000 mg | ORAL_TABLET | Freq: Every day | ORAL | 3 refills | Status: DC

## 2022-06-12 NOTE — Telephone Encounter (Signed)
Requested medication (s) are due for refill today - no  Requested medication (s) are on the active medication list -yes  Future visit scheduled -yes  Last refill: 07/06/21 #90 3RF  Notes to clinic: non delegated Rx  Requested Prescriptions  Pending Prescriptions Disp Refills   RYBELSUS 14 MG TABS 90 tablet 3    Sig: Take 1 tablet (14 mg total) by mouth daily.     Off-Protocol Failed - 06/11/2022  3:17 PM      Failed - Medication not assigned to a protocol, review manually.      Passed - Valid encounter within last 12 months    Recent Outpatient Visits           3 weeks ago Annual physical exam   [place], DO   7 months ago Type 2 diabetes mellitus with other specified complication, without long-term current use of insulin ([place])   [facility], Devonne Doughty, DO   10 months ago Type 2 diabetes mellitus with other specified complication, without long-term current use of insulin ([place])   [facility] [place], Devonne Doughty, DO   1 year ago Annual physical exam   [facility] Olin Hauser, DO   1 year ago Type 2 diabetes mellitus with other specified complication, without long-term current use of insulin ([place])   [facility] [place], Devonne Doughty, DO       Future Appointments             In 2 months [place], Devonne Doughty, DO [facility], [facility]               Requested Prescriptions  Pending Prescriptions Disp Refills   RYBELSUS 14 MG TABS 90 tablet 3    Sig: Take 1 tablet (14 mg total) by mouth daily.     Off-Protocol Failed - 06/11/2022  3:17 PM      Failed - Medication not assigned to a protocol, review manually.      Passed - Valid encounter within last 12 months    Recent Outpatient Visits           3 weeks ago Annual physical exam   [place], DO   7 months ago Type 2  diabetes mellitus with other specified complication, without long-term current use of insulin [facility])   [facility], Devonne Doughty, DO   10 months ago Type 2 diabetes mellitus with other specified complication, without long-term current use of insulin [facility])   [place], DO   1 year ago Annual physical exam   [facility] Olin Hauser, DO   1 year ago Type 2 diabetes mellitus with other specified complication, without long-term current use of insulin [facility])   [facility] [place], Devonne Doughty, DO       Future Appointments             In 2 months [place], Devonne Doughty, DO [facility], [facility]

## 2022-08-01 ENCOUNTER — Other Ambulatory Visit: Payer: Self-pay | Admitting: Family Medicine

## 2022-08-01 DIAGNOSIS — E1169 Type 2 diabetes mellitus with other specified complication: Secondary | ICD-10-CM

## 2022-08-01 NOTE — Telephone Encounter (Signed)
Requested Prescriptions  Pending Prescriptions Disp Refills   lisinopril (ZESTRIL) 10 MG tablet [Pharmacy Med Name: LISINOPRIL TABS '10MG'$ ] 90 tablet 3    Sig: TAKE 1 TABLET DAILY     Cardiovascular:  ACE Inhibitors Passed - 08/01/2022 12:17 AM      Passed - Cr in normal range and within 180 days    Creat  Date Value Ref Range Status  05/11/2022 0.84 0.50 - 1.05 mg/dL Final         Passed - K in normal range and within 180 days    Potassium  Date Value Ref Range Status  05/11/2022 4.2 3.5 - 5.3 mmol/L Final         Passed - Patient is not pregnant      Passed - Last BP in normal range    BP Readings from Last 1 Encounters:  05/17/22 138/84         Passed - Valid encounter within last 6 months    Recent Outpatient Visits           2 months ago Annual physical exam   [facility] [place], Devonne Doughty, DO   8 months ago Type 2 diabetes mellitus with other specified complication, without long-term current use of insulin [facility])   [facility] [place], Devonne Doughty, DO   11 months ago Type 2 diabetes mellitus with other specified complication, without long-term current use of insulin [facility])   [place], DO   1 year ago Annual physical exam   [facility] Olin Hauser, DO   1 year ago Type 2 diabetes mellitus with other specified complication, without long-term current use of insulin [facility])   [facility], Devonne Doughty, DO       Future Appointments             In 2 weeks [place], Devonne Doughty, DO [facility], [facility]

## 2022-08-07 ENCOUNTER — Other Ambulatory Visit: Payer: Self-pay | Admitting: Family Medicine

## 2022-08-07 DIAGNOSIS — E1169 Type 2 diabetes mellitus with other specified complication: Secondary | ICD-10-CM

## 2022-08-07 NOTE — Telephone Encounter (Signed)
Requested Prescriptions  Pending Prescriptions Disp Refills   metFORMIN (GLUCOPHAGE-XR) 500 MG 24 hr tablet [Pharmacy Med Name: METFORMIN HCL ER TABS '500MG'$ ] 360 tablet 0    Sig: TAKE 1 TABLET IN THE MORNING, 1 TABLET AT LUNCH AND 2 TABLETS AT SUPPER     Endocrinology:  Diabetes - Biguanides Failed - 08/07/2022 12:09 AM      Failed - B12 Level in normal range and within 720 days    Vitamin B-12  Date Value Ref Range Status  01/09/2016 >2,000  Final         Passed - Cr in normal range and within 360 days    Creat  Date Value Ref Range Status  05/11/2022 0.84 0.50 - 1.05 mg/dL Final         Passed - HBA1C is between 0 and 7.9 and within 180 days    Hemoglobin A1C  Date Value Ref Range Status  09/19/2018 6.5  Final   Hgb A1c MFr Bld  Date Value Ref Range Status  05/11/2022 7.8 (H) <5.7 % of total Hgb Final    Comment:    For someone without known diabetes, a hemoglobin A1c value of 6.5% or greater indicates that they may have  diabetes and this should be confirmed with a follow-up  test. . For someone with known diabetes, a value <7% indicates  that their diabetes is well controlled and a value  greater than or equal to 7% indicates suboptimal  control. A1c targets should be individualized based on  duration of diabetes, age, comorbid conditions, and  other considerations. . Currently, no consensus exists regarding use of hemoglobin A1c for diagnosis of diabetes for children. .          Passed - eGFR in normal range and within 360 days    GFR, Est African American  Date Value Ref Range Status  05/17/2020 72 > OR = 60 mL/min/1.63m Final   GFR, Est Non African American  Date Value Ref Range Status  05/17/2020 62 > OR = 60 mL/min/1.771mFinal   eGFR  Date Value Ref Range Status  05/11/2022 75 > OR = 60 mL/min/1.7371minal         Passed - Valid encounter within last 6 months    Recent Outpatient Visits           2 months ago Annual physical exam   [facility]   9 months ago Type 2 diabetes mellitus with other specified complication, without long-term current use of insulin [facility]   12 months ago Type 2 diabetes mellitus with other specified complication, without long-term current use of insulin ([facility]   1 year ago Annual physical exam   [facility]   1 year ago Type 2 diabetes mellitus with other specified complication, without long-term current use of insulin ([facility]       Future Appointments             In 1 week [place]onne DoughtyO [facility] normal limits and completed in the last 12 months    WBC  Date Value Ref Range Status  05/11/2022 5.9 3.8 - 10.8 Thousand/uL Final   RBC  Date Value Ref Range Status  05/11/2022 4.38 3.80 - 5.10 Million/uL Final   Hemoglobin  Date Value Ref Range Status  05/11/2022 13.2 11.7 - 15.5 g/dL Final   HCT  Date Value Ref Range Status  05/11/2022 39.7 35.0 - 45.0 % Final   MCHC  Date Value Ref Range Status  05/11/2022 33.2 32.0 - 36.0 g/dL Final   [facility]  Date Value Ref Range Status  05/11/2022 30.1 27.0 - 33.0 pg Final   MCV  Date Value Ref Range Status  05/11/2022 90.6 80.0 - 100.0 fL Final   No results found for: "PLTCOUNTKUC", "LABPLAT", "POCPLA" RDW  Date Value Ref Range Status  05/11/2022 12.5 11.0 - 15.0 % Final

## 2022-08-17 ENCOUNTER — Encounter: Payer: Self-pay | Admitting: Family Medicine

## 2022-08-17 ENCOUNTER — Ambulatory Visit: Payer: Medicare HMO | Admitting: Family Medicine

## 2022-08-17 VITALS — BP 138/84 | HR 76 | Ht 59.0 in | Wt 220.0 lb

## 2022-08-17 DIAGNOSIS — E1169 Type 2 diabetes mellitus with other specified complication: Secondary | ICD-10-CM | POA: Diagnosis not present

## 2022-08-17 DIAGNOSIS — Z1231 Encounter for screening mammogram for malignant neoplasm of breast: Secondary | ICD-10-CM | POA: Diagnosis not present

## 2022-08-17 DIAGNOSIS — E785 Hyperlipidemia, unspecified: Secondary | ICD-10-CM

## 2022-08-17 DIAGNOSIS — K635 Polyp of colon: Secondary | ICD-10-CM | POA: Diagnosis not present

## 2022-08-17 DIAGNOSIS — G5792 Unspecified mononeuropathy of left lower limb: Secondary | ICD-10-CM

## 2022-08-17 DIAGNOSIS — Z6841 Body Mass Index (BMI) 40.0 and over, adult: Secondary | ICD-10-CM

## 2022-08-17 DIAGNOSIS — E1141 Type 2 diabetes mellitus with diabetic mononeuropathy: Secondary | ICD-10-CM

## 2022-08-17 DIAGNOSIS — Z1211 Encounter for screening for malignant neoplasm of colon: Secondary | ICD-10-CM

## 2022-08-17 NOTE — Patient Instructions (Addendum)
Thank you for coming to the office today.  We can increase dose Rybelsus in the future if needed.  Referral to Neurologist for evaluation of peripheral neuropathy.  [facility] - Neurology Dept [place], [place] [address] Phone: [phone]  A1c lab test today. Stay tuned for a phone call next week. If you do not hear from [place] by Tuesday afternoon, call back and check in for the result.  Referral to [facility] for Colonoscopy anytime. They will call you  For Mammogram screening for breast cancer   Call the [place] below anytime to schedule your own appointment now that order has been placed.  Last mammogram 10/18/21  [facility] [place], [address], [place] [address] Phone: [phone]   Please schedule a Follow-up Appointment to: Return in about 4 months (around 12/16/2022) for 4 month follow-up DM A1c.  If you have any other questions or concerns, please feel free to call the office or send a message through [place]. You may also schedule an earlier appointment if necessary.  Additionally, you may be receiving a survey about your experience at our office within a few days to 1 week by e-mail or mail. We value your feedback.  Nobie Putnam, DO [place]

## 2022-08-17 NOTE — Progress Notes (Signed)
Subjective:    Patient ID: Tina Carey, female    DOB: 1951/06/09, 72 y.o.   MRN: [id number]  Tina Carey is a 72 y.o. female presenting on 08/17/2022 for Diabetes, Hypertension, and Obesity   HPI  CHRONIC DM, Type 2 Morbid Obesity BMI >44   No alcohol since 1993 fam history brother/sister, paternal grandmother with diabetes   Improved lifestyle gym, and reduced carbs starch and sugars. Limited coffee. Limited sweets   Due for A1c today, our machine POC is not working. She will need lab. CBGs: Avg 104-150 Meds: Metformin XR x 2 = 523m 10086mdose in pm with supper, Rybelsus 1472maily Reports good compliance. Tolerating well w/o side-effects Currently on ACEi Admits some constipation on Rybelsus, taking stool softener AM and PM Lifestyle: Weight down 8-10 lbs - Diet (avoiding sweets, avoiding eating late PM after 7, she does eat healthy snacks and some reduced portions, and still occasional potato chips on weekend she enjoys) Goal to avoid future bagel, raisin toast, banana to improve her diet - Exercise (walking 10k steps per day on machine, gym 5-6 days a week with up to 2-3 hours of workout) Denies hypoglycemia, polyuria, visual changes.   HYPERLIPIDEMIA: - Reports no concerns. Last lipid panel mostly controlled LDL 114. Declines Statin therapy due to risk of side effect     Additional problem   Arthritis / Finger Stiffness Reports both ring fingers, difficulty closing fingers into hand in fist, if she is active and moving and joints are warmed up she can move them better. - Tylenol   Loss of friend, unexpected, January 2024. She has had anxiety related to this. She said went to bed, feet tingling, she went to gym and was active and had no issues, then it started again in the evening when resting. It did keep her up which was unusual She has had issues with episodic symptoms of tingling in both feet. Some mild tingling at times, has been improved  overall.    Health Maintenance: Updated [id number].  She asks about RSV if indicated.  Due Colon CA Screening since 04/2022, [facility] polyp, adenoma, rpt 5 year  - [facility]   08/17/2022    9:07 AM 05/17/2022    1:18 PM 11/10/2021    8:22 AM  Depression screen PHQ 2/9  Decreased Interest 0 0 0  Down, Depressed, Hopeless 0 0 0  PHQ - 2 Score 0 0 0  Altered sleeping 0 0 0  Tired, decreased energy 0 0 0  Change in appetite 0 0 0  Feeling bad or failure about yourself  0 0 0  Trouble concentrating 0 0 0  Moving slowly or fidgety/restless 0 0 0  Suicidal thoughts 0 0 0  PHQ-9 Score 0 0 0  Difficult doing work/chores Not difficult at all Not difficult at all Not difficult at all    Social History   Tobacco Use   Smoking status: Never   Smokeless tobacco: Never  Vaping Use   Vaping Use: Never used  Substance Use Topics   Alcohol use: No   Drug use: Never    Review of Systems Per HPI unless specifically indicated above     Objective:    BP 138/84   Pulse 76   Ht 4' 11"$  (1.499 m)   Wt 220 lb (99.8 kg)   SpO2 99%   BMI 44.43  kg/m   Wt Readings from Last 3 Encounters:  08/17/22 220 lb (99.8 kg)  05/17/22 228 lb 12.8 oz (103.8 kg)  11/17/21 222 lb (100.7 kg)    Physical Exam Vitals and nursing note reviewed.  Constitutional:      General: She is not in acute distress.    Appearance: She is well-developed. She is obese. She is not diaphoretic.     Comments: Well-appearing, comfortable, cooperative  HENT:     Head: Normocephalic and atraumatic.  Eyes:     General:        Right eye: No discharge.        Left eye: No discharge.     Conjunctiva/sclera: Conjunctivae normal.  Neck:     Thyroid: No thyromegaly.  Cardiovascular:     Rate and Rhythm: Normal rate and regular rhythm.     Heart sounds: Normal heart sounds. No murmur heard. Pulmonary:     Effort: Pulmonary effort  is normal. No respiratory distress.     Breath sounds: Normal breath sounds. No wheezing or rales.  Musculoskeletal:        General: Normal range of motion.     Cervical back: Normal range of motion and neck supple.  Lymphadenopathy:     Cervical: No cervical adenopathy.  Skin:    General: Skin is warm and dry.     Findings: No erythema or rash.  Neurological:     Mental Status: She is alert and oriented to person, place, and time.  Psychiatric:        Behavior: Behavior normal.     Comments: Well groomed, good eye contact, normal speech and thoughts     Diabetic Foot Exam - Simple   Simple Foot Form Diabetic Foot exam was performed with the following findings: Yes 08/17/2022  9:15 AM  Visual Inspection No deformities, no ulcerations, no other skin breakdown bilaterally: Yes Sensation Testing Intact to touch and monofilament testing bilaterally: Yes Pulse Check Posterior Tibialis and Dorsalis pulse intact bilaterally: Yes Comments     Recent Labs    11/10/21 0826 05/11/22 0833  HGBA1C 6.7* 7.8*     Results for orders placed or performed in visit on 05/10/22  TSH  Result Value Ref Range   TSH 0.51 0.40 - 4.50 mIU/L  Hemoglobin A1c  Result Value Ref Range   Hgb A1c MFr Bld 7.8 (H) <5.7 % of total Hgb   Mean Plasma Glucose 177 mg/dL   eAG (mmol/L) 9.8 mmol/L  Lipid panel  Result Value Ref Range   Cholesterol 188 <200 mg/dL   HDL 56 > OR = 50 mg/dL   Triglycerides 85 <150 mg/dL   LDL Cholesterol (Calc) 114 (H) mg/dL (calc)   Total CHOL/HDL Ratio 3.4 <5.0 (calc)   Non-HDL Cholesterol (Calc) 132 (H) <130 mg/dL (calc)  CBC with Differential/Platelet  Result Value Ref Range   WBC 5.9 3.8 - 10.8 Thousand/uL   RBC 4.38 3.80 - 5.10 Million/uL   Hemoglobin 13.2 11.7 - 15.5 g/dL   HCT 39.7 35.0 - 45.0 %   MCV 90.6 80.0 - 100.0 fL   MCH 30.1 27.0 - 33.0 pg   MCHC 33.2 32.0 - 36.0 g/dL   RDW 12.5 11.0 - 15.0 %   Platelets 366 140 - 400 Thousand/uL   MPV 9.8 7.5 -  12.5 fL   Neutro Abs 3,286 1,500 - 7,800 cells/uL   Lymphs Abs 2,047 850 - 3,900 cells/uL   Absolute Monocytes 336 200 - 950 cells/uL  Eosinophils Absolute 171 15 - 500 cells/uL   Basophils Absolute 59 0 - 200 cells/uL   Neutrophils Relative % 55.7 %   Total Lymphocyte 34.7 %   Monocytes Relative 5.7 %   Eosinophils Relative 2.9 %   Basophils Relative 1.0 %  COMPLETE METABOLIC PANEL WITH GFR  Result Value Ref Range   Glucose, Bld 138 (H) 65 - 99 mg/dL   BUN 26 (H) 7 - 25 mg/dL   Creat 0.84 0.50 - 1.05 mg/dL   eGFR 75 > OR = 60 mL/min/1.74m   BUN/Creatinine Ratio 31 (H) 6 - 22 (calc)   Sodium 138 135 - 146 mmol/L   Potassium 4.2 3.5 - 5.3 mmol/L   Chloride 102 98 - 110 mmol/L   CO2 28 20 - 32 mmol/L   Calcium 10.0 8.6 - 10.4 mg/dL   Total Protein 7.0 6.1 - 8.1 g/dL   Albumin 4.3 3.6 - 5.1 g/dL   Globulin 2.7 1.9 - 3.7 g/dL (calc)   AG Ratio 1.6 1.0 - 2.5 (calc)   Total Bilirubin 0.4 0.2 - 1.2 mg/dL   Alkaline phosphatase (APISO) 68 37 - 153 U/L   AST 18 10 - 35 U/L   ALT 17 6 - 29 U/L      Assessment & Plan:   Problem List Items Addressed This Visit     Hyperlipidemia associated with type 2 diabetes mellitus (HCC)   Relevant Medications   metFORMIN (GLUCOPHAGE-XR) 500 MG 24 hr tablet   Morbid obesity with BMI of 45.0-49.9, adult (HCC)   Relevant Medications   metFORMIN (GLUCOPHAGE-XR) 500 MG 24 hr tablet   Type 2 diabetes mellitus with other specified complication (HCC) - Primary   Relevant Medications   metFORMIN (GLUCOPHAGE-XR) 500 MG 24 hr tablet   Other Relevant Orders   Urine Microalbumin w/creat. 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 Not mychart We can increase dose Rybelsus in the future if needed. Future higher dose 25-50 available Continue Metformin dose She declines injections GLP or Soliqua Concern diabetic neuropathy, vs nerve impingement L sided Referral to Neurologist for evaluation of peripheral neuropathy.  [facility]  Due mammogram 10/2022, she will call to schedule.  No orders of the defined types were placed in this encounter.   Orders Placed This Encounter  Procedures   MM 3D SCREEN BREAST BILATERAL    Standing Status:   Future    Standing Expiration Date:   08/18/2023    Order Specific Question:   Reason for  Exam (SYMPTOM  OR DIAGNOSIS REQUIRED)    Answer:   Screening bilateral 3D Mammogram Tom[place]    Order Specific Question:   Preferred imaging location?    Answer:   [facility]   Urine Microalbumin w/creat. ratio   Hemoglobin A1c   Ambulatory referral to Neurology    Referral Priority:   Routine    Referral Type:   Consultation    Referral Reason:   Specialty Services Required    Requested Specialty:   Neurology    Number of Visits Requested:   1   Ambulatory referral to Gastroenterology    Referral Priority:   Routine    Referral Type:   Consultation    Referral Reason:   Specialty Services Required    Number of Visits Requested:   1    Follow up plan: Return in about 4 months (around 12/16/2022) for 4 month follow-up DM A1c.   Nobie Putnam, [facility] 08/17/2022, 9:25 AM

## 2022-08-18 LAB — HEMOGLOBIN A1C
Hgb A1c MFr Bld: 7.2 % of total Hgb — ABNORMAL HIGH (ref <5.7)
Mean Plasma Glucose: 160 mg/dL
eAG (mmol/L): 8.9 mmol/L

## 2022-08-18 LAB — MICROALBUMIN / CREATININE URINE RATIO
Creatinine, Urine: 26 mg/dL (ref 20–275)
Microalb, Ur: <0.2 mg/dL

## 2022-09-05 LAB — HM DIABETES EYE EXAM

## 2022-10-25 ENCOUNTER — Ambulatory Visit
Admission: RE | Admit: 2022-10-25 | Discharge: 2022-10-25 | Disposition: A | Discharge status: Home / Self Care | Payer: Medicare HMO | Source: Ambulatory Visit | Attending: Family Medicine | Admitting: Family Medicine

## 2022-10-25 DIAGNOSIS — Z1231 Encounter for screening mammogram for malignant neoplasm of breast: Secondary | ICD-10-CM | POA: Insufficient documentation

## 2022-11-02 ENCOUNTER — Other Ambulatory Visit: Payer: Self-pay | Admitting: Family Medicine

## 2022-11-02 DIAGNOSIS — E1169 Type 2 diabetes mellitus with other specified complication: Secondary | ICD-10-CM

## 2022-11-02 NOTE — Telephone Encounter (Signed)
Requested Prescriptions  Pending Prescriptions Disp Refills   metFORMIN (GLUCOPHAGE-XR) 500 MG 24 hr tablet [Pharmacy Med Name: METFORMIN HCL ER TABS 500MG ] 360 tablet 0    Sig: TAKE 1 TABLET IN THE MORNING, 1 TABLET AT LUNCH AND 2 TABLETS AT SUPPER     Endocrinology:  Diabetes - Biguanides Failed - 11/02/2022 12:26 AM      Failed - B12 Level in normal range and within 720 days    Vitamin B-12  Date Value Ref Range Status  01/09/2016 >2,000  Final         Passed - Cr in normal range and within 360 days    Creat  Date Value Ref Range Status  05/11/2022 0.84 0.50 - 1.05 mg/dL Final   Creatinine, Urine  Date Value Ref Range Status  08/17/2022 26 20 - 275 mg/dL Final         Passed - HBA1C is between 0 and 7.9 and within 180 days    Hemoglobin A1C  Date Value Ref Range Status  09/19/2018 6.5  Final   Hgb A1c MFr Bld  Date Value Ref Range Status  08/17/2022 7.2 (H) <5.7 % of total Hgb Final    Comment:    For someone without known diabetes, a hemoglobin A1c value of 6.5% or greater indicates that they may have  diabetes and this should be confirmed with a follow-up  test. . For someone with known diabetes, a value <7% indicates  that their diabetes is well controlled and a value  greater than or equal to 7% indicates suboptimal  control. A1c targets should be individualized based on  duration of diabetes, age, comorbid conditions, and  other considerations. . Currently, no consensus exists regarding use of hemoglobin A1c for diagnosis of diabetes for children. .          Passed - eGFR in normal range and within 360 days    GFR, Est African American  Date Value Ref Range Status  05/17/2020 72 > OR = 60 mL/min/1.69m2 Final   GFR, Est Non African American  Date Value Ref Range Status  05/17/2020 62 > OR = 60 mL/min/1.49m2 Final   eGFR  Date Value Ref Range Status  05/11/2022 75 > OR = 60 mL/min/1.47m2 Final         Passed - Valid encounter within last 6 months     Recent Outpatient Visits           2 months ago Type 2 diabetes mellitus with other specified complication, without long-term current use of insulin [facility])   [place] [facility] [place], Netta Neat, DO   5 months ago Annual physical exam   [place] [facility] Smitty Cords, DO   11 months ago Type 2 diabetes mellitus with other specified complication, without long-term current use of insulin [facility])   [place] [facility] [place], Netta Neat, DO   1 year ago Type 2 diabetes mellitus with other specified complication, without long-term current use of insulin [facility])   [place] [facility] Smitty Cords, DO   1 year ago Annual physical exam   [place] [facility] Smitty Cords, DO       Future Appointments             In 1 month Althea Charon, Netta Neat, DO [place] [facility], [facility]  Passed - CBC within normal limits and completed in the last 12 months    WBC  Date Value Ref Range Status  05/11/2022 5.9 3.8 - 10.8 Thousand/uL Final   RBC  Date Value Ref Range Status  05/11/2022 4.38 3.80 - 5.10 Million/uL Final   Hemoglobin  Date Value Ref Range Status  05/11/2022 13.2 11.7 - 15.5 g/dL Final   HCT  Date Value Ref Range Status  05/11/2022 39.7 35.0 - 45.0 % Final   MCHC  Date Value Ref Range Status  05/11/2022 33.2 32.0 - 36.0 g/dL Final   [facility]  Date Value Ref Range Status  05/11/2022 30.1 27.0 - 33.0 pg Final   MCV  Date Value Ref Range Status  05/11/2022 90.6 80.0 - 100.0 fL Final   No results found for: "PLTCOUNTKUC", "LABPLAT", "POCPLA" RDW  Date Value Ref Range Status  05/11/2022 12.5 11.0 - 15.0 % Final          lisinopril (ZESTRIL) 10 MG tablet [Pharmacy Med Name: LISINOPRIL TABS 10MG ] 90 tablet 0    Sig: TAKE 1 TABLET DAILY     Cardiovascular:  ACE Inhibitors Passed - 11/02/2022 12:26 AM       Passed - Cr in normal range and within 180 days    Creat  Date Value Ref Range Status  05/11/2022 0.84 0.50 - 1.05 mg/dL Final   Creatinine, Urine  Date Value Ref Range Status  08/17/2022 26 20 - 275 mg/dL Final         Passed - K in normal range and within 180 days    Potassium  Date Value Ref Range Status  05/11/2022 4.2 3.5 - 5.3 mmol/L Final         Passed - Patient is not pregnant      Passed - Last BP in normal range    BP Readings from Last 1 Encounters:  08/17/22 138/84         Passed - Valid encounter within last 6 months    Recent Outpatient Visits           2 months ago Type 2 diabetes mellitus with other specified complication, without long-term current use of insulin [facility])   [place] [facility] [place], Netta Neat, DO   5 months ago Annual physical exam   [place] [facility] Smitty Cords, DO   11 months ago Type 2 diabetes mellitus with other specified complication, without long-term current use of insulin [facility])   [facility] Smitty Cords, DO   1 year ago Type 2 diabetes mellitus with other specified complication, without long-term current use of insulin [facility])   [place] [facility] Smitty Cords, DO   1 year ago Annual physical exam   [place] [facility] Smitty Cords, DO       Future Appointments             In 1 month Althea Charon, Netta Neat, DO [place] [facility], [facility]

## 2022-12-21 ENCOUNTER — Ambulatory Visit: Payer: Medicare HMO | Admitting: Family Medicine

## 2022-12-26 ENCOUNTER — Ambulatory Visit (INDEPENDENT_AMBULATORY_CARE_PROVIDER_SITE_OTHER): Payer: Medicare HMO | Admitting: Family Medicine

## 2022-12-26 ENCOUNTER — Encounter: Payer: Self-pay | Admitting: Family Medicine

## 2022-12-26 VITALS — BP 132/70 | HR 75 | Temp 98.3°F | Resp 18 | Ht 59.0 in | Wt 222.6 lb

## 2022-12-26 DIAGNOSIS — Z6841 Body Mass Index (BMI) 40.0 and over, adult: Secondary | ICD-10-CM | POA: Diagnosis not present

## 2022-12-26 DIAGNOSIS — M65341 Trigger finger, right ring finger: Secondary | ICD-10-CM

## 2022-12-26 DIAGNOSIS — E1169 Type 2 diabetes mellitus with other specified complication: Secondary | ICD-10-CM | POA: Diagnosis not present

## 2022-12-26 LAB — POCT GLYCOSYLATED HEMOGLOBIN (HGB A1C): Hemoglobin A1C: 7 % — AB (ref 4.0–5.6)

## 2022-12-26 NOTE — Patient Instructions (Addendum)
Thank you for coming to the office today.  Recent Labs    05/11/22 0833 08/17/22 0932 12/26/22 0900  HGBA1C 7.8* 7.2* 7.0*   Improved A1c. Good job! We will keep on current medications Rybelsus, we can dose increase once it is available in future.  Check with Orthopedic office about the Trigger Finger issue with your ring fingers. It can get stuck due to the pulley system in the hand with the tendons and be stiff sore  For Hand Arthritis START anti inflammatory topical - OTC Voltaren (generic Diclofenac) topical 2-4 times a day as needed for pain swelling of affected joint for 1-2 weeks or longer.  I can refer you if they tell you which doctor / location they need.  DUE for FASTING BLOOD WORK (no food or drink after midnight before the lab appointment, only water or coffee without cream/sugar on the morning of)  SCHEDULE "Lab Only" visit in the morning at the clinic for lab draw in 5 MONTHS   - Make sure Lab Only appointment is at about 1 week before your next appointment, so that results will be available  For Lab Results, once available within 2-3 days of blood draw, you can can log in to MyChart online to view your results and a brief explanation. Also, we can discuss results at next follow-up visit.   Please schedule a Follow-up Appointment to: Return in about 5 months (around 05/28/2023) for 5 month fasting lab only then 1 week later Annual Physical.  If you have any other questions or concerns, please feel free to call the office or send a message through MyChart. You may also schedule an earlier appointment if necessary.  Additionally, you may be receiving a survey about your experience at our office within a few days to 1 week by e-mail or mail. We value your feedback.  Saralyn Pilar, DO [facility], [place]

## 2022-12-26 NOTE — Progress Notes (Unsigned)
Subjective:    Patient ID: Tina Carey, female    DOB: 1951/06/10, 72 y.o.   MRN: [id number]  Tina Carey is a 72 y.o. female presenting on 12/26/2022 for Diabetes Mellitus   HPI  CHRONIC DM, Type 2 Morbid Obesity BMI >44  Recent history birthdays and travel and vacation She has kept up walking 5 days a week several miles, and gym exercise back at home She has reduced carb sugar sweets She has reduced Salt sodium intake, mostly no longer using.   Improved lifestyle gym, and reduced carbs starch and sugars. Limited coffee. Limited sweets CBGs: Avg 100-140  Meds: Metformin XR x 2 = 500mg  1000mg  dose in pm with supper, Rybelsus 14mg  daily Reports good compliance. Tolerating well w/o side-effects Currently on ACEi Admits some constipation on Rybelsus, taking stool softener AM and PM No alcohol since 1993 fam history brother/sister, paternal grandmother with diabetes  Lifestyle: Weight up 2 lbs in 4 months - Diet (avoiding sweets, avoiding eating late PM after 7, she does eat healthy snacks and some reduced portions) - Exercise (See above, walking 10k steps per day on machine, gym 5-6 days a week with up to 2-3 hours of workout) Denies hypoglycemia, polyuria, visual changes.  Additionally  Trigger Finger Bilateral Ring R>L Difficulty with pain and stiffness and triggering that interferes with function, interested to speak with ortho about this for possible injection or procedure treatment options.   HM  Mammogram completed, negative 10/2022 Colonoscopy 10/16/22 - 3 polyp, repeat 5 years.     12/26/2022    8:47 AM 08/17/2022    9:07 AM 05/17/2022    1:18 PM  Depression screen PHQ 2/9  Decreased Interest 0 0 0  Down, Depressed, Hopeless 0 0 0  PHQ - 2 Score 0 0 0  Altered sleeping 0 0 0  Tired, decreased energy 0 0 0  Change in appetite 1 0 0  Feeling bad or failure about yourself  0 0 0  Trouble concentrating 0 0 0  Moving slowly or fidgety/restless 0 0 0   Suicidal thoughts 0 0 0  PHQ-9 Score 1 0 0  Difficult doing work/chores Not difficult at all Not difficult at all Not difficult at all    Social History   Tobacco Use   Smoking status: Never   Smokeless tobacco: Never  Vaping Use   Vaping Use: Never used  Substance Use Topics   Alcohol use: No   Drug use: Never    Review of Systems Per HPI unless specifically indicated above     Objective:    BP 132/70 (BP Location: Left Arm, Patient Position: Sitting, Cuff Size: Normal)   Pulse 75   Temp 98.3 F (36.8 C) (Oral)   Resp 18   Ht 4\' 11"  (1.499 m)   Wt 222 lb 9.6 oz (101 kg)   SpO2 97%   BMI 44.96 kg/m   Wt Readings from Last 3 Encounters:  12/26/22 222 lb 9.6 oz (101 kg)  08/17/22 220 lb (99.8 kg)  05/17/22 228 lb 12.8 oz (103.8 kg)    Physical Exam Vitals and nursing note reviewed.  Constitutional:      General: She is not in acute distress.    Appearance: She is well-developed. She is obese. She is not diaphoretic.     Comments: Well-appearing, comfortable, cooperative  HENT:     Head: Normocephalic and atraumatic.  Eyes:     General:        Right  eye: No discharge.        Left eye: No discharge.     Conjunctiva/sclera: Conjunctivae normal.  Neck:     Thyroid: No thyromegaly.  Cardiovascular:     Rate and Rhythm: Normal rate and regular rhythm.     Heart sounds: Normal heart sounds. No murmur heard. Pulmonary:     Effort: Pulmonary effort is normal. No respiratory distress.     Breath sounds: Normal breath sounds. No wheezing or rales.  Musculoskeletal:        General: Normal range of motion.     Cervical back: Normal range of motion and neck supple.  Lymphadenopathy:     Cervical: No cervical adenopathy.  Skin:    General: Skin is warm and dry.     Findings: No erythema or rash.  Neurological:     Mental Status: She is alert and oriented to person, place, and time.  Psychiatric:        Behavior: Behavior normal.     Comments: Well groomed, good  eye contact, normal speech and thoughts     Recent Labs    05/11/22 0833 08/17/22 0932 12/26/22 0900  HGBA1C 7.8* 7.2* 7.0*     Results for orders placed or performed in visit on 12/26/22  POCT glycosylated hemoglobin (Hb A1C)  Result Value Ref Range   Hemoglobin A1C 7.0 (A) 4.0 - 5.6 %      Assessment & Plan:   Problem List Items Addressed This Visit     Morbid obesity with BMI of 40.0-44.9, adult (HCC)   Type 2 diabetes mellitus with other specified complication (HCC) - Primary    Elevated A1c to 7.8 now, previously 6.7 range Some wt gain but has major overhaul lifestyle diet and exercise  Complications - hyperlipidemia, GERD,  morbid obesity, - increases risk of future cardiovascular complications   Plan:  Continue Rybelsus 14 mg daily has refills - As discussed, in future hopefully 2024 we will have available the higher doses 25 or 50mg  in future for better weight loss. - Reconsider GIP/GLP1 with [facility], she will reconsider. Continue Metformin XR 500mg  x 2 with dinner only  2. Encourage improved lifestyle - low carb, low sugar diet, reduce portion size, continue improving regular exercise 3. Check CBG , bring log to next visit for review 4. Continue ACEi      Relevant Orders   POCT glycosylated hemoglobin (Hb A1C) (Completed)   Other Visit Diagnoses     Trigger ring finger of right hand             Trigger Fingers, Ring  Check with Orthopedic office about the Trigger Finger issue with your ring fingers. It can get stuck due to the pulley system in the hand with the tendons and be stiff sore  For Hand Arthritis START anti inflammatory topical - OTC Voltaren (generic Diclofenac) topical 2-4 times a day as needed for pain swelling of affected joint for 1-2 weeks or longer.  I can refer you if they tell you which doctor / location they need.  No orders of the defined types were placed in this encounter.    Follow up plan: Return in about 5 months  (around 05/28/2023) for 5 month fasting lab only then 1 week later Annual Physical.  Future labs ordered for 05/2023   Tina Pilar, DO [facility] 12/26/2022, 8:59 AM

## 2022-12-26 NOTE — Assessment & Plan Note (Signed)
Elevated A1c to 7.8 now, previously 6.7 range Some wt gain but has major overhaul lifestyle diet and exercise  Complications - hyperlipidemia, GERD,  morbid obesity, - increases risk of future cardiovascular complications   Plan:  Continue Rybelsus 14 mg daily has refills - As discussed, in future hopefully 2024 we will have available the higher doses 25 or 50mg in future for better weight loss. - Reconsider GIP/GLP1 with [facility], she will reconsider. Continue Metformin XR 500mg x 2 with dinner only  2. Encourage improved lifestyle - low carb, low sugar diet, reduce portion size, continue improving regular exercise 3. Check CBG , bring log to next visit for review 4. Continue ACEi 

## 2022-12-27 ENCOUNTER — Encounter: Payer: Self-pay | Admitting: Family Medicine

## 2022-12-27 ENCOUNTER — Other Ambulatory Visit: Payer: Self-pay | Admitting: Family Medicine

## 2022-12-27 DIAGNOSIS — E1169 Type 2 diabetes mellitus with other specified complication: Secondary | ICD-10-CM

## 2022-12-27 DIAGNOSIS — Z0000 Encounter for general adult medical examination without abnormal findings: Secondary | ICD-10-CM

## 2023-01-31 ENCOUNTER — Other Ambulatory Visit: Payer: Self-pay | Admitting: Family Medicine

## 2023-01-31 DIAGNOSIS — E1169 Type 2 diabetes mellitus with other specified complication: Secondary | ICD-10-CM

## 2023-01-31 DIAGNOSIS — K219 Gastro-esophageal reflux disease without esophagitis: Secondary | ICD-10-CM

## 2023-01-31 NOTE — Telephone Encounter (Signed)
Requested Prescriptions  Pending Prescriptions Disp Refills   lisinopril (ZESTRIL) 10 MG tablet [Pharmacy Med Name: LISINOPRIL TABS 10MG ] 90 tablet 1    Sig: TAKE 1 TABLET DAILY     Cardiovascular:  ACE Inhibitors Failed - 01/31/2023 12:24 AM      Failed - Cr in normal range and within 180 days    Creat  Date Value Ref Range Status  05/11/2022 0.84 0.50 - 1.05 mg/dL Final   Creatinine, Urine  Date Value Ref Range Status  08/17/2022 26 20 - 275 mg/dL Final         Failed - K in normal range and within 180 days    Potassium  Date Value Ref Range Status  05/11/2022 4.2 3.5 - 5.3 mmol/L Final         Passed - Patient is not pregnant      Passed - Last BP in normal range    BP Readings from Last 1 Encounters:  12/26/22 132/70         Passed - Valid encounter within last 6 months    Recent Outpatient Visits           1 month ago Type 2 diabetes mellitus with other specified complication, without long-term current use of insulin (HCC)   [place] [facility] [place], Netta Neat, DO   5 months ago Type 2 diabetes mellitus with other specified complication, without long-term current use of insulin (HCC)   [place] [facility] [place], Netta Neat, DO   8 months ago Annual physical exam   [facility] Smitty Cords, DO   1 year ago Type 2 diabetes mellitus with other specified complication, without long-term current use of insulin (HCC)   [place] [facility] [place], Netta Neat, DO   1 year ago Type 2 diabetes mellitus with other specified complication, without long-term current use of insulin (HCC)   [place] [facility] [place], Netta Neat, DO       Future Appointments             In 3 months Althea Charon, Netta Neat, DO [facility], [facility]             metFORMIN (GLUCOPHAGE-XR) 500 MG 24 hr tablet [Pharmacy Med  Name: METFORMIN HCL ER TABS 500MG ] 360 tablet 1    Sig: TAKE 1 TABLET IN THE MORNING, 1 TABLET AT LUNCH AND 2 TABLETS AT SUPPER     Endocrinology:  Diabetes - Biguanides Failed - 01/31/2023 12:24 AM      Failed - B12 Level in normal range and within 720 days    Vitamin B-12  Date Value Ref Range Status  01/09/2016 >2,000  Final         Passed - Cr in normal range and within 360 days    Creat  Date Value Ref Range Status  05/11/2022 0.84 0.50 - 1.05 mg/dL Final   Creatinine, Urine  Date Value Ref Range Status  08/17/2022 26 20 - 275 mg/dL Final         Passed - HBA1C is between 0 and 7.9 and within 180 days    Hemoglobin A1C  Date Value Ref Range Status  12/26/2022 7.0 (A) 4.0 - 5.6 % Final  09/19/2018 6.5  Final   Hgb A1c MFr Bld  Date Value Ref Range Status  08/17/2022 7.2 (H) <5.7 % of total Hgb Final  Comment:    For someone without known diabetes, a hemoglobin A1c value of 6.5% or greater indicates that they may have  diabetes and this should be confirmed with a follow-up  test. . For someone with known diabetes, a value <7% indicates  that their diabetes is well controlled and a value  greater than or equal to 7% indicates suboptimal  control. A1c targets should be individualized based on  duration of diabetes, age, comorbid conditions, and  other considerations. . Currently, no consensus exists regarding use of hemoglobin A1c for diagnosis of diabetes for children. .          Passed - eGFR in normal range and within 360 days    GFR, Est African American  Date Value Ref Range Status  05/17/2020 72 > OR = 60 mL/min/1.73m2 Final   GFR, Est Non African American  Date Value Ref Range Status  05/17/2020 62 > OR = 60 mL/min/1.32m2 Final   eGFR  Date Value Ref Range Status  05/11/2022 75 > OR = 60 mL/min/1.26m2 Final         Passed - Valid encounter within last 6 months    Recent Outpatient Visits           1 month ago Type 2 diabetes mellitus with  other specified complication, without long-term current use of insulin (HCC)   [facility] [place], Netta Neat, DO   5 months ago Type 2 diabetes mellitus with other specified complication, without long-term current use of insulin (HCC)   [place] [facility] [place], Netta Neat, DO   8 months ago Annual physical exam   [place] [facility] Smitty Cords, DO   1 year ago Type 2 diabetes mellitus with other specified complication, without long-term current use of insulin (HCC)   [place] [facility], Netta Neat, DO   1 year ago Type 2 diabetes mellitus with other specified complication, without long-term current use of insulin (HCC)   [place] [facility] [place], Netta Neat, DO       Future Appointments             In 3 months Althea Charon, Netta Neat, DO [place] [facility]            Passed - CBC within normal limits and completed in the last 12 months    WBC  Date Value Ref Range Status  05/11/2022 5.9 3.8 - 10.8 Thousand/uL Final   RBC  Date Value Ref Range Status  05/11/2022 4.38 3.80 - 5.10 Million/uL Final   Hemoglobin  Date Value Ref Range Status  05/11/2022 13.2 11.7 - 15.5 g/dL Final   HCT  Date Value Ref Range Status  05/11/2022 39.7 35.0 - 45.0 % Final   MCHC  Date Value Ref Range Status  05/11/2022 33.2 32.0 - 36.0 g/dL Final   [facility]  Date Value Ref Range Status  05/11/2022 30.1 27.0 - 33.0 pg Final   MCV  Date Value Ref Range Status  05/11/2022 90.6 80.0 - 100.0 fL Final   No results found for: "PLTCOUNTKUC", "LABPLAT", "POCPLA" RDW  Date Value Ref Range Status  05/11/2022 12.5 11.0 - 15.0 % Final          pantoprazole (PROTONIX) 40 MG tablet [Pharmacy Med Name: PANTOPRAZOLE SODIUM DR TABS 40MG ] 90 tablet 1    Sig: TAKE 1 TABLET DAILY BEFORE BREAKFAST  Gastroenterology:  Proton Pump Inhibitors Passed - 01/31/2023 12:24 AM      Passed - Valid encounter within last 12 months    Recent Outpatient Visits           1 month ago Type 2 diabetes mellitus with other specified complication, without long-term current use of insulin [facility])   [facility] [facility] [place], Netta Neat, DO   5 months ago Type 2 diabetes mellitus with other specified complication, without long-term current use of insulin [facility])   [place] [facility] [place], Netta Neat, DO   8 months ago Annual physical exam   [place] [facility] Smitty Cords, DO   1 year ago Type 2 diabetes mellitus with other specified complication, without long-term current use of insulin [facility])   [place] [facility] [place], Netta Neat, DO   1 year ago Type 2 diabetes mellitus with other specified complication, without long-term current use of insulin [facility])   [place] [facility] Althea Charon, Netta Neat, DO       Future Appointments             In 3 months Althea Charon, Netta Neat, DO [facility], [facility]

## 2023-05-23 ENCOUNTER — Other Ambulatory Visit: Payer: Medicare HMO

## 2023-05-23 DIAGNOSIS — E1169 Type 2 diabetes mellitus with other specified complication: Secondary | ICD-10-CM

## 2023-05-23 DIAGNOSIS — Z0000 Encounter for general adult medical examination without abnormal findings: Secondary | ICD-10-CM

## 2023-05-24 LAB — CBC WITH DIFFERENTIAL/PLATELET
Absolute Lymphocytes: 2504 {cells}/uL (ref 850–3900)
Absolute Monocytes: 437 {cells}/uL (ref 200–950)
Basophils Absolute: 70 {cells}/uL (ref 0–200)
Basophils Relative: 0.9 %
Eosinophils Absolute: 172 {cells}/uL (ref 15–500)
Eosinophils Relative: 2.2 %
HCT: 42.6 % (ref 35.0–45.0)
Hemoglobin: 13.5 g/dL (ref 11.7–15.5)
MCH: 29.3 pg (ref 27.0–33.0)
MCHC: 31.7 g/dL — ABNORMAL LOW (ref 32.0–36.0)
MCV: 92.4 fL (ref 80.0–100.0)
MPV: 9.9 fL (ref 7.5–12.5)
Monocytes Relative: 5.6 %
Neutro Abs: 4618 {cells}/uL (ref 1500–7800)
Neutrophils Relative %: 59.2 %
Platelets: 399 10*3/uL (ref 140–400)
RBC: 4.61 10*6/uL (ref 3.80–5.10)
RDW: 12.7 % (ref 11.0–15.0)
Total Lymphocyte: 32.1 %
WBC: 7.8 10*3/uL (ref 3.8–10.8)

## 2023-05-24 LAB — COMPLETE METABOLIC PANEL WITH GFR
AG Ratio: 1.6 (calc) (ref 1.0–2.5)
ALT: 17 U/L (ref 6–29)
AST: 19 U/L (ref 10–35)
Albumin: 4.6 g/dL (ref 3.6–5.1)
Alkaline phosphatase (APISO): 65 U/L (ref 37–153)
BUN/Creatinine Ratio: 32 (calc) — ABNORMAL HIGH (ref 6–22)
BUN: 29 mg/dL — ABNORMAL HIGH (ref 7–25)
CO2: 29 mmol/L (ref 20–32)
Calcium: 10.7 mg/dL — ABNORMAL HIGH (ref 8.6–10.4)
Chloride: 101 mmol/L (ref 98–110)
Creat: 0.92 mg/dL (ref 0.60–1.00)
Globulin: 2.8 g/dL (ref 1.9–3.7)
Glucose, Bld: 127 mg/dL — ABNORMAL HIGH (ref 65–99)
Potassium: 4.8 mmol/L (ref 3.5–5.3)
Sodium: 141 mmol/L (ref 135–146)
Total Bilirubin: 0.4 mg/dL (ref 0.2–1.2)
Total Protein: 7.4 g/dL (ref 6.1–8.1)
eGFR: 67 mL/min/{1.73_m2} (ref >=60)

## 2023-05-24 LAB — TSH: TSH: 0.7 m[IU]/L (ref 0.40–4.50)

## 2023-05-24 LAB — LIPID PANEL
Cholesterol: 195 mg/dL (ref <200)
HDL: 63 mg/dL (ref >=50)
LDL Cholesterol (Calc): 109 mg/dL — ABNORMAL HIGH
Non-HDL Cholesterol (Calc): 132 mg/dL — ABNORMAL HIGH (ref <130)
Total CHOL/HDL Ratio: 3.1 (calc) (ref <5.0)
Triglycerides: 122 mg/dL (ref <150)

## 2023-05-24 LAB — HEMOGLOBIN A1C
Hgb A1c MFr Bld: 6.7 %{Hb} — ABNORMAL HIGH (ref <5.7)
Mean Plasma Glucose: 146 mg/dL
eAG (mmol/L): 8.1 mmol/L

## 2023-05-27 LAB — HM DIABETES EYE EXAM

## 2023-05-28 ENCOUNTER — Encounter: Payer: Self-pay | Admitting: Family Medicine

## 2023-05-29 ENCOUNTER — Ambulatory Visit (INDEPENDENT_AMBULATORY_CARE_PROVIDER_SITE_OTHER): Payer: Medicare HMO | Admitting: Family Medicine

## 2023-05-29 ENCOUNTER — Encounter: Payer: Self-pay | Admitting: Family Medicine

## 2023-05-29 ENCOUNTER — Encounter: Payer: Medicare HMO | Admitting: Family Medicine

## 2023-05-29 VITALS — BP 130/70 | Ht 59.0 in | Wt 227.2 lb

## 2023-05-29 DIAGNOSIS — Z0000 Encounter for general adult medical examination without abnormal findings: Secondary | ICD-10-CM

## 2023-05-29 DIAGNOSIS — E1169 Type 2 diabetes mellitus with other specified complication: Secondary | ICD-10-CM | POA: Diagnosis not present

## 2023-05-29 DIAGNOSIS — Z6841 Body Mass Index (BMI) 40.0 and over, adult: Secondary | ICD-10-CM

## 2023-05-29 DIAGNOSIS — E113299 Type 2 diabetes mellitus with mild nonproliferative diabetic retinopathy without macular edema, unspecified eye: Secondary | ICD-10-CM | POA: Diagnosis not present

## 2023-05-29 DIAGNOSIS — E785 Hyperlipidemia, unspecified: Secondary | ICD-10-CM

## 2023-05-29 NOTE — Patient Instructions (Addendum)
Thank you for coming to the office today.   Recent Labs    08/17/22 0932 12/26/22 0900 05/23/23 0805  HGBA1C 7.2* 7.0* 6.7*   Lipid Panel     Component Value Date/Time   CHOL 195 05/23/2023 0805   TRIG 122 05/23/2023 0805   HDL 63 05/23/2023 0805   CHOLHDL 3.1 05/23/2023 0805   LDLCALC 109 (H) 05/23/2023 0805     Please schedule a Follow-up Appointment to: Return in about 6 months (around 11/26/2023) for 6 month PDM A1c, Weight.  If you have any other questions or concerns, please feel free to call the office or send a message through MyChart. You may also schedule an earlier appointment if necessary.  Additionally, you may be receiving a survey about your experience at our office within a few days to 1 week by e-mail or mail. We value your feedback.  Saralyn Pilar, DO [facility], [place]

## 2023-05-29 NOTE — Progress Notes (Unsigned)
Subjective:    Patient ID: Tina Carey, female    DOB: 02-04-1951, 72 y.o.   MRN: [id number]  Tina Carey is a 72 y.o. female presenting on 05/29/2023 for Annual Exam   HPI  Discussed the use of AI scribe software for clinical note transcription with the patient, who gave verbal consent to proceed.  History of Present Illness           CHRONIC DM, Type 2 Morbid Obesity BMI >45  Improving lifestyle, diet, reduced portions, exercising every week with walking miles and gym exercise She has reduced carb sugar sweets She has reduced Salt sodium intake, mostly no longer using. A1c down to 6.7  CBGs: Avg 100-140 Meds: Metformin XR x 2 = 500mg  1000mg  dose in pm with supper, Rybelsus 14mg  daily Reports good compliance. Tolerating well w/o side-effects Currently on ACEi Admits some constipation on Rybelsus, taking stool softener AM and PM No alcohol since 1993 fam history brother/sister, paternal grandmother with diabetes Last DM Eye Exam completed 2024 Non proliferative diabetic retinopathy, background Denies hypoglycemia, polyuria, visual changes.  HYPERLIPIDEMIA: - Reports no concerns. 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 Repeat 2029  Up to date on all vaccines Flu  PNEUMONIA COVID  Due for Shingrix but she declines      05/29/2023    8:16 AM 12/26/2022    8:47 AM 08/17/2022    9:07 AM  Depression screen PHQ 2/9  Decreased Interest 0 0 0  Down, Depressed, Hopeless 0 0 0  PHQ - 2 Score 0 0 0  Altered sleeping  0 0  Tired,  decreased energy  0 0  Change in appetite  1 0  Feeling bad or failure about yourself   0 0  Trouble concentrating  0 0  Moving slowly or fidgety/restless  0 0  Suicidal thoughts  0 0  PHQ-9 Score  1 0  Difficult doing work/chores  Not difficult at all Not difficult at all       05/29/2023    8:16 AM 12/26/2022    8:47 AM 08/17/2022    9:07 AM 05/17/2022    1:18 PM  GAD 7 : Generalized Anxiety Score  Nervous, Anxious, on Edge 0 0 0 0  Control/stop worrying 0 0 0 0  Worry too much - different things 0 0 0 0  Trouble relaxing 0 0 0 0  Restless 0 0 0 0  Easily annoyed or irritable 0 0 0 0  Afraid - awful might happen 0 0 0 0  Total GAD 7 Score 0 0 0 0  Anxiety Difficulty  Not difficult at all Not difficult at all Not difficult at all     Past Medical History:  Diagnosis Date   Arthritis    knees   Cancer (HCC) 2009   sinus   GERD (gastroesophageal reflux disease)    Osteoporosis    Past Surgical History:  Procedure Laterality Date   CATARACT EXTRACTION W/PHACO Left 06/17/2017   Procedure: CATARACT EXTRACTION PHACO AND INTRAOCULAR LENS PLACEMENT (IOC)  LEFT DIABETIC;  Surgeon: Lockie Mola, MD;  [facility];  Service: Ophthalmology;  Laterality: Left;  Diabetic - oral meds Does NOT want to be first   CATARACT EXTRACTION W/PHACO Right 10/23/2017   Procedure: CATARACT EXTRACTION PHACO AND INTRAOCULAR LENS PLACEMENT (IOC) RIGHT DIABETIC;  Surgeon: Lockie Mola, MD;  [facility];  Service: Ophthalmology;  Laterality: Right;  diabetic -oral meds   GASTRIC BYPASS     NASAL SINUS SURGERY  2009   Social History   Socioeconomic History   Marital status: Married    Spouse name: Not on file   Number of children: Not on file   Years of education: Not on file   Highest education level: Not on file  Occupational History   Not on file  Tobacco Use   Smoking status: Never   Smokeless tobacco: Never  Vaping Use   Vaping status:  Never Used  Substance and Sexual Activity   Alcohol use: No   Drug use: Never   Sexual activity: Not on file  Other Topics Concern   Not on file  Social History Narrative   Not on file   Social Determinants of Health   Financial Resource Strain: Low Risk  (05/29/2023)   Overall Financial Resource Strain (CARDIA)    Difficulty of Paying Living Expenses: Not hard at all  Food Insecurity: No Food Insecurity (05/29/2023)   Hunger Vital Sign    Worried About Running Out of Food in the Last Year: Never true    Ran Out of Food in the Last Year: Never true  Transportation Needs: No Transportation Needs (05/29/2023)   PRAPARE - Administrator, Civil Service (Medical): No    Lack of Transportation (Non-Medical): No  Physical Activity: Sufficiently Active (05/29/2023)   Exercise Vital Sign    Days of Exercise per Week: 5 days    Minutes of Exercise per Session: 150+ min  Stress: No Stress Concern Present (05/29/2023)   [employer] of Occupational Health - Occupational Stress Questionnaire    Feeling of Stress : Not at all  Social Connections: Moderately Isolated (05/29/2023)   Social Connection and Isolation Panel [NHANES]    Frequency of Communication with Friends and Family: More than three times a week    Frequency of Social Gatherings with Friends and Family: Twice a week    Attends Religious Services: More than 4 times per year    Active Member of [employer] or Organizations: No    Attends Banker Meetings: Never    Marital Status: Widowed  Intimate Partner Violence: Not At Risk (05/29/2023)   Humiliation, Afraid, Rape, and Kick questionnaire    Fear of Current or Ex-Partner: No    Emotionally Abused: No    Physically Abused: No    Sexually Abused: No   Family History  Problem Relation Age of Onset   Stroke Mother    Diabetes Mother    Diabetes Maternal Grandmother    Diabetes Paternal Grandmother    Diabetes Brother    Breast cancer Neg Hx     Current Outpatient Medications on File Prior to Visit  Medication Sig   calcium carbonate (OS-CAL) 1250 (500 Ca) MG chewable tablet Chew 1 tablet by mouth daily.   Cholecalciferol (VITAMIN D3 PO) Take 200 Units by mouth daily.   Coenzyme Q-10 200 MG CAPS Take 200 mg by mouth daily.   cyanocobalamin 100 MCG tablet Take by mouth.   lisinopril (ZESTRIL) 10 MG tablet TAKE 1 TABLET DAILY  magnesium gluconate (MAGONATE) 500 MG tablet Take 500 mg by mouth 2 (two) times daily.   Multiple Vitamin (MULTIVITAMIN) capsule Take 1 capsule by mouth daily.   pantoprazole (PROTONIX) 40 MG tablet TAKE 1 TABLET DAILY BEFORE BREAKFAST   pyridOXINE (VITAMIN B-6) 50 MG tablet Take 50 mg by mouth daily.   RYBELSUS 14 MG TABS Take 1 tablet (14 mg total) by mouth daily.   TURMERIC CURCUMIN PO Take by mouth.   Turmeric, Curcuma Longa, (CURCUMIN) POWD Take 100 mg by mouth 2 times daily at 12 noon and 4 pm.   metFORMIN (GLUCOPHAGE-XR) 500 MG 24 hr tablet Take 2 tablets (1,000 mg total) by mouth daily with supper.   No current facility-administered medications on file prior to visit.    Review of Systems Per HPI unless specifically indicated above     Objective:    BP 130/70   Ht 4\' 11"  (1.499 m)   Wt 227 lb 3.2 oz (103.1 kg)   BMI 45.89 kg/m   Wt Readings from Last 3 Encounters:  05/29/23 227 lb 3.2 oz (103.1 kg)  12/26/22 222 lb 9.6 oz (101 kg)  08/17/22 220 lb (99.8 kg)    Physical Exam  Recent Labs    08/17/22 0932 12/26/22 0900 05/23/23 0805  HGBA1C 7.2* 7.0* 6.7*    Results for orders placed or performed in visit on 05/28/23  HM DIABETES EYE EXAM  Result Value Ref Range   HM Diabetic Eye Exam Retinopathy (A) No Retinopathy      Assessment & Plan:   Problem List Items Addressed This Visit     Hyperlipidemia associated with type 2 diabetes mellitus (HCC)   Relevant Medications   metFORMIN (GLUCOPHAGE-XR) 500 MG 24 hr tablet   Morbid obesity with BMI of 45.0-49.9, adult (HCC)    Relevant Medications   metFORMIN (GLUCOPHAGE-XR) 500 MG 24 hr tablet   NPDR (nonproliferative diabetic retinopathy) (HCC)   Relevant Medications   metFORMIN (GLUCOPHAGE-XR) 500 MG 24 hr tablet   Type 2 diabetes mellitus with other specified complication (HCC)   Relevant Medications   metFORMIN (GLUCOPHAGE-XR) 500 MG 24 hr tablet   Other Visit Diagnoses     Annual physical exam    -  Primary        Updated Health Maintenance information Reviewed recent lab results with patient Encouraged improvement to lifestyle with diet and exercise Goal of weight loss  Assessment and Plan           ***Future CT   No orders of the defined types were placed in this encounter.   No orders of the defined types were placed in this encounter.    Follow up plan: Return in about 6 months (around 11/26/2023) for 6 month PDM A1c, Weight.  Saralyn Pilar, DO [facility] 05/29/2023, 8:06 AM

## 2023-05-30 NOTE — Assessment & Plan Note (Signed)
Controlled cholesterol The 10-year ASCVD risk score (Arnett DK, et al., 2019) is: 26.2%  Plan: 1. Offered statin therapy. She declines today due to concerns regarding side effects despite counseling on benefits. 2. Encourage improved lifestyle - low carb/cholesterol, reduce portion size, continue improving regular exercise

## 2023-05-30 NOTE — Assessment & Plan Note (Addendum)
Improved A1c to 6.7 Wt loss and lifestyle improved Complications - diabetic retinopathy NPDR, hyperlipidemia, GERD,  morbid obesity, - increases risk of future cardiovascular complications   Plan:  Continue Rybelsus 14 mg daily has refills - As discussed, in future hopefully 2025 we will have available the higher doses 25 or 50mg  in future for better weight loss. - Reconsider GIP/GLP1 with [facility], she will reconsider. Continue Metformin XR 500mg  x 2 with dinner only  2. Encourage improved lifestyle - low carb, low sugar diet, reduce portion size, continue improving regular exercise 3. Check CBG , bring log to next visit for review 4. Continue ACEi

## 2023-06-03 ENCOUNTER — Other Ambulatory Visit: Payer: Self-pay | Admitting: Family Medicine

## 2023-06-03 DIAGNOSIS — E1169 Type 2 diabetes mellitus with other specified complication: Secondary | ICD-10-CM

## 2023-06-03 NOTE — Telephone Encounter (Signed)
Requested medication (s) are due for refill today - yes  Requested medication (s) are on the active medication list -yes  Future visit scheduled -yes  Last refill: 06/12/22 #90 3RF  Notes to clinic: off protocol- provider review   Requested Prescriptions  Pending Prescriptions Disp Refills   RYBELSUS 14 MG TABS [Pharmacy Med Name: RYBELSUS TABS 30'S 14MG ] 90 tablet 3    Sig: TAKE 1 TABLET DAILY     Off-Protocol Failed - 06/03/2023  2:24 AM      Failed - Medication not assigned to a protocol, review manually.      Passed - Valid encounter within last 12 months    Recent Outpatient Visits           5 days ago Annual physical exam   [place] [facility], Netta Neat, DO   5 months ago Type 2 diabetes mellitus with other specified complication, without long-term current use of insulin [facility])   [place] [facility], Netta Neat, DO   9 months ago Type 2 diabetes mellitus with other specified complication, without long-term current use of insulin [facility])   [place] [facility] Althea Charon, Netta Neat, DO   1 year ago Annual physical exam   [place] [facility] Smitty Cords, DO   1 year ago Type 2 diabetes mellitus with other specified complication, without long-term current use of insulin [facility])   [place] [facility] Althea Charon, Netta Neat, DO       Future Appointments             In 6 months Althea Charon, Netta Neat, DO [place] [facility], [facility]               Requested Prescriptions  Pending Prescriptions Disp Refills   RYBELSUS 14 MG TABS [Pharmacy Med Name: RYBELSUS TABS 30'S 14MG ] 90 tablet 3    Sig: TAKE 1 TABLET DAILY     Off-Protocol Failed - 06/03/2023  2:24 AM      Failed - Medication not assigned to a protocol, review manually.      Passed - Valid encounter within last 12 months    Recent Outpatient Visits            5 days ago Annual physical exam   [place] [facility] [place], Netta Neat, DO   5 months ago Type 2 diabetes mellitus with other specified complication, without long-term current use of insulin [facility])   [place] [facility] [place], Netta Neat, DO   9 months ago Type 2 diabetes mellitus with other specified complication, without long-term current use of insulin [facility])   [place] [facility] Smitty Cords, DO   1 year ago Annual physical exam   [place] Smitty Cords, DO   1 year ago Type 2 diabetes mellitus with other specified complication, without long-term current use of insulin [facility])   [place] [facility] Althea Charon, Netta Neat, DO       Future Appointments             In 6 months Althea Charon, Netta Neat, DO [place] [facility], [facility]

## 2023-07-15 ENCOUNTER — Telehealth: Payer: Self-pay

## 2023-07-15 NOTE — Telephone Encounter (Signed)
 Left message for patient to return call. We need the pharmacy to fax paper work for her authorization for Rybellsus

## 2023-07-15 NOTE — Telephone Encounter (Signed)
 Patient returning call. Please f/u with patient

## 2023-07-15 NOTE — Telephone Encounter (Signed)
 Copied from CRM [id number]. Topic: General - Other >> Jul 15, 2023  9:03 AM Tina Carey wrote: Reason for CRM: Pt reports that she received a letter stating that a prior authorization is needed for RYBELSUS  14 MG TABS.     Left VM for patient to return call

## 2023-07-26 NOTE — Telephone Encounter (Signed)
No further action needed at this time.

## 2023-07-26 NOTE — Telephone Encounter (Signed)
Pt called back and informed of Amanda's message. Pt verbalized understanding.

## 2023-09-24 ENCOUNTER — Other Ambulatory Visit: Payer: Self-pay | Admitting: Family Medicine

## 2023-09-24 DIAGNOSIS — Z1231 Encounter for screening mammogram for malignant neoplasm of breast: Secondary | ICD-10-CM

## 2023-10-28 ENCOUNTER — Encounter

## 2023-10-29 ENCOUNTER — Ambulatory Visit
Admission: RE | Admit: 2023-10-29 | Discharge: 2023-10-29 | Disposition: A | Discharge status: Home / Self Care | Source: Ambulatory Visit | Attending: Family Medicine | Admitting: Family Medicine

## 2023-10-29 DIAGNOSIS — Z1231 Encounter for screening mammogram for malignant neoplasm of breast: Secondary | ICD-10-CM | POA: Insufficient documentation

## 2023-11-12 LAB — HM DIABETES EYE EXAM

## 2023-12-04 ENCOUNTER — Encounter: Payer: Self-pay | Admitting: Family Medicine

## 2023-12-04 ENCOUNTER — Ambulatory Visit: Payer: Self-pay | Admitting: Family Medicine

## 2023-12-04 VITALS — BP 132/64 | HR 82 | Ht 59.0 in | Wt 237.2 lb

## 2023-12-04 DIAGNOSIS — Z7984 Long term (current) use of oral hypoglycemic drugs: Secondary | ICD-10-CM | POA: Diagnosis not present

## 2023-12-04 DIAGNOSIS — K219 Gastro-esophageal reflux disease without esophagitis: Secondary | ICD-10-CM | POA: Diagnosis not present

## 2023-12-04 DIAGNOSIS — E1169 Type 2 diabetes mellitus with other specified complication: Secondary | ICD-10-CM | POA: Diagnosis not present

## 2023-12-04 DIAGNOSIS — Z6841 Body Mass Index (BMI) 40.0 and over, adult: Secondary | ICD-10-CM

## 2023-12-04 LAB — POCT GLYCOSYLATED HEMOGLOBIN (HGB A1C): Hemoglobin A1C: 7.4 % — AB (ref 4.0–5.6)

## 2023-12-04 MED ORDER — LISINOPRIL 10 MG PO TABS: 10.0000 mg | ORAL_TABLET | Freq: Every day | ORAL | 3 refills | Status: AC

## 2023-12-04 MED ORDER — PANTOPRAZOLE SODIUM 40 MG PO TBEC: 40.0000 mg | DELAYED_RELEASE_TABLET | Freq: Every day | ORAL | 1 refills | Status: DC

## 2023-12-04 NOTE — Progress Notes (Signed)
 Subjective:    Patient ID: Tina Carey, female    DOB: 1951/01/29, 73 y.o.   MRN: [id number]  Tina Carey is a 73 y.o. female presenting on 12/04/2023 for Diabetes   HPI  Discussed the use of AI scribe software for clinical note transcription with the patient, who gave verbal consent to proceed.  History of Present Illness   Tina Carey is a 73 year old female with diabetes who presents for a routine follow-up visit.      CHRONIC DM, Type 2 Morbid Obesity BMI >47 Her recent HbA1c level is 7.4%, up from 6.7% a year ago, with previous readings of 7.0% and 7.2%. She is determined to lower it back to under 7.0%.   Improving lifestyle, diet, reduced portions, however while on vacation recently she has not followed diet well, exercising every week with walking miles and gym exercise even while on vacation she walked every day. She has reduced carb sugar sweets CBGs: Recent 140-180, has improved now < 150 Meds: Metformin  XR x 2 = 500mg  1000mg  dose in pm with supper, Rybelsus  14mg  daily - Added Cinnamon supplement, help reduce CBGs Reports good compliance. Tolerating well w/o side-effects Currently on ACEi Admits some constipation on Rybelsus , taking stool softener AM and PM No alcohol since 1993 fam history brother/sister, paternal grandmother with diabetes Last DM Eye Exam completed 2024 Non proliferative diabetic retinopathy, background Denies hypoglycemia, polyuria, visual changes.   HYPERLIPIDEMIA: - Reports no concerns. Last lipid panel 05/2023, controlled 107 Not on medication   The patient expressed interest in a heart CT scan during her next visit to assess her risk of heart disease, given her diabetic status. She is aware of the out-of-pocket cost and is considering the procedure to ensure her heart health.      Health Maintenance:   Mammogram completed, negative 10/2023 Colonoscopy 10/16/22 - 3 polyp, repeat 5 years. Repeat 2029   Up to date on all vaccines  Flu  PNEUMONIA COVID   Due for Shingrix but she declines      12/04/2023    8:13 AM 05/29/2023    8:16 AM 12/26/2022    8:47 AM  Depression screen PHQ 2/9  Decreased Interest 0 0 0  Down, Depressed, Hopeless 0 0 0  PHQ - 2 Score 0 0 0  Altered sleeping 0  0  Tired, decreased energy 0  0  Change in appetite 0  1  Feeling bad or failure about yourself  0  0  Trouble concentrating 0  0  Moving slowly or fidgety/restless 0  0  Suicidal thoughts 0  0  PHQ-9 Score 0  1  Difficult doing work/chores   Not difficult at all       12/04/2023    8:14 AM 05/29/2023    8:16 AM 12/26/2022    8:47 AM 08/17/2022    9:07 AM  GAD 7 : Generalized Anxiety Score  Nervous, Anxious, on Edge 0 0 0 0  Control/stop worrying 0 0 0 0  Worry too much - different things 0 0 0 0  Trouble relaxing 0 0 0 0  Restless 0 0 0 0  Easily annoyed or irritable 0 0 0 0  Afraid - awful might happen 0 0 0 0  Total GAD 7 Score 0 0 0 0  Anxiety Difficulty   Not difficult at all Not difficult at all     Past Medical History:  Diagnosis Date   Arthritis  knees   Cancer (HCC) 2009   sinus   GERD (gastroesophageal reflux disease)    Osteoporosis    Past Surgical History:  Procedure Laterality Date   CATARACT EXTRACTION W/PHACO Left 06/17/2017   Procedure: CATARACT EXTRACTION PHACO AND INTRAOCULAR LENS PLACEMENT (IOC)  LEFT DIABETIC;  Surgeon: Annell Kidney, MD;  Location: [facility];  Service: Ophthalmology;  Laterality: Left;  Diabetic - oral meds Does NOT want to be first   CATARACT EXTRACTION W/PHACO Right 10/23/2017   Procedure: CATARACT EXTRACTION PHACO AND INTRAOCULAR LENS PLACEMENT (IOC) RIGHT DIABETIC;  Surgeon: Annell Kidney, MD;  Location: [facility];  Service: Ophthalmology;  Laterality: Right;  diabetic -oral meds   GASTRIC BYPASS     NASAL SINUS SURGERY  2009   Social History   Socioeconomic History   Marital status: Married    Spouse name: Not on file   Number  of children: Not on file   Years of education: Not on file   Highest education level: Not on file  Occupational History   Not on file  Tobacco Use   Smoking status: Never   Smokeless tobacco: Never  Vaping Use   Vaping status: Never Used  Substance and Sexual Activity   Alcohol use: No   Drug use: Never   Sexual activity: Not on file  Other Topics Concern   Not on file  Social History Narrative   Not on file   Social Drivers of Health   Financial Resource Strain: Low Risk  (05/29/2023)   Overall Financial Resource Strain (CARDIA)    Difficulty of Paying Living Expenses: Not hard at all  Food Insecurity: No Food Insecurity (05/29/2023)   Hunger Vital Sign    Worried About Running Out of Food in the Last Year: Never true    Ran Out of Food in the Last Year: Never true  Transportation Needs: No Transportation Needs (05/29/2023)   PRAPARE - Administrator, Civil Service (Medical): No    Lack of Transportation (Non-Medical): No  Physical Activity: Sufficiently Active (05/29/2023)   Exercise Vital Sign    Days of Exercise per Week: 5 days    Minutes of Exercise per Session: 150+ min  Stress: No Stress Concern Present (05/29/2023)   [employer] of Occupational Health - Occupational Stress Questionnaire    Feeling of Stress : Not at all  Social Connections: Moderately Isolated (05/29/2023)   Social Connection and Isolation Panel [NHANES]    Frequency of Communication with Friends and Family: More than three times a week    Frequency of Social Gatherings with Friends and Family: Twice a week    Attends Religious Services: More than 4 times per year    Active Member of [employer] or Organizations: No    Attends Banker Meetings: Never    Marital Status: Widowed  Intimate Partner Violence: Not At Risk (05/29/2023)   Humiliation, Afraid, Rape, and Kick questionnaire    Fear of Current or Ex-Partner: No    Emotionally Abused: No    Physically Abused: No     Sexually Abused: No   Family History  Problem Relation Age of Onset   Stroke Mother    Diabetes Mother    Diabetes Maternal Grandmother    Diabetes Paternal Grandmother    Diabetes Brother    Breast cancer Neg Hx    Current Outpatient Medications on File Prior to Visit  Medication Sig   calcium carbonate (OS-CAL) 1250 (500 Ca) MG chewable tablet  Chew 1 tablet by mouth daily.   Cholecalciferol (VITAMIN D3 PO) Take 200 Units by mouth daily.   Cinnamon 500 MG TABS Take by mouth in the morning and at bedtime.   Coenzyme Q-10 200 MG CAPS Take 200 mg by mouth daily.   cyanocobalamin 100 MCG tablet Take by mouth.   magnesium gluconate (MAGONATE) 500 MG tablet Take 500 mg by mouth 2 (two) times daily.   metFORMIN  (GLUCOPHAGE -XR) 500 MG 24 hr tablet Take 2 tablets (1,000 mg total) by mouth daily with supper.   Multiple Vitamin (MULTIVITAMIN) capsule Take 1 capsule by mouth daily.   pyridOXINE (VITAMIN B-6) 50 MG tablet Take 50 mg by mouth daily.   RYBELSUS  14 MG TABS Take 1 tablet (14 mg total) by mouth daily before breakfast.   TURMERIC CURCUMIN PO Take by mouth.   Turmeric, Curcuma Longa, (CURCUMIN) POWD Take 100 mg by mouth 2 times daily at 12 noon and 4 pm.   No current facility-administered medications on file prior to visit.    Review of Systems Per HPI unless specifically indicated above     Objective:     BP 132/64 (BP Location: Left Arm, Patient Position: Sitting, Cuff Size: Large)   Pulse 82   Ht 4\' 11"  (1.499 m)   Wt 237 lb 4 oz (107.6 kg)   SpO2 99%   BMI 47.92 kg/m   Wt Readings from Last 3 Encounters:  12/04/23 237 lb 4 oz (107.6 kg)  05/29/23 227 lb 3.2 oz (103.1 kg)  12/26/22 222 lb 9.6 oz (101 kg)    Physical Exam Vitals and nursing note reviewed.  Constitutional:      General: She is not in acute distress.    Appearance: Normal appearance. She is well-developed. She is obese. She is not diaphoretic.     Comments: Well-appearing, comfortable,  cooperative  HENT:     Head: Normocephalic and atraumatic.  Eyes:     General:        Right eye: No discharge.        Left eye: No discharge.     Conjunctiva/sclera: Conjunctivae normal.  Cardiovascular:     Rate and Rhythm: Normal rate.  Pulmonary:     Effort: Pulmonary effort is normal.  Skin:    General: Skin is warm and dry.     Findings: No erythema or rash.  Neurological:     Mental Status: She is alert and oriented to person, place, and time.  Psychiatric:        Mood and Affect: Mood normal.        Behavior: Behavior normal.        Thought Content: Thought content normal.     Comments: Well groomed, good eye contact, normal speech and thoughts     Diabetic Foot Exam - Simple   Simple Foot Form Diabetic Foot exam was performed with the following findings: Yes 12/04/2023  8:22 AM  Visual Inspection See comments: Yes Sensation Testing Intact to touch and monofilament testing bilaterally: Yes Pulse Check Posterior Tibialis and Dorsalis pulse intact bilaterally: Yes Comments Left foot 2nd and 3rd toes with thickening toenail onychomycosis, uncomplicated. No callus or ulceration.      Results for orders placed or performed in visit on 12/04/23  POCT HgB A1C   Collection Time: 12/04/23  8:20 AM  Result Value Ref Range   Hemoglobin A1C 7.4 (A) 4.0 - 5.6 %   HbA1c POC (<> result, manual entry)     HbA1c, POC (  prediabetic range)     HbA1c, POC (controlled diabetic range)        Assessment & Plan:   Problem List Items Addressed This Visit     Morbid obesity with BMI of 45.0-49.9, adult (HCC)   Type 2 diabetes mellitus with other specified complication (HCC) - Primary   Relevant Medications   lisinopril  (ZESTRIL ) 10 MG tablet   pantoprazole  (PROTONIX ) 40 MG tablet   Other Relevant Orders   POCT HgB A1C (Completed)   Microalbumin / creatinine urine ratio   Other Visit Diagnoses       Gastroesophageal reflux disease, unspecified whether esophagitis present        Relevant Medications   pantoprazole  (PROTONIX ) 40 MG tablet     Long term current use of oral hypoglycemic drug            Updated Health Maintenance information Encouraged improvement to lifestyle with diet and exercise Goal of weight loss  Type 2 Diabetes Mellitus HbA1c increased to 7.4%, indicating suboptimal glycemic control. She experiences hyperglycemia during travel and reports decreased efficacy of Rybelsus . Motivated to reduce HbA1c below 7% with lifestyle modifications. - Order urine test for annual monitoring urine microalbumin - Continue metformin  XR 500 x 2 = 1000mg  daily and Rybelsus  14mg  - Encourage dietary and exercise modifications. - Schedule follow-up in December for annual review and blood work.  Hypertension Blood pressure well-controlled at 132/64 mmHg on lisinopril . - Refill [facility]isinopril  [facility].  Coronary Artery Disease Screening Considering CT scan for coronary artery disease as a new screening option for asymptomatic patients. - Provide information on CT scan for coronary artery disease screening and discuss further in December.       Orders Placed This Encounter  Procedures   Microalbumin / creatinine urine ratio   POCT HgB A1C    Meds ordered this encounter  Medications   lisinopril  (ZESTRIL ) 10 MG tablet    Sig: Take 1 tablet (10 mg total) by mouth daily.    Dispense:  90 tablet    Refill:  3    Add future refills   pantoprazole  (PROTONIX ) 40 MG tablet    Sig: Take 1 tablet (40 mg total) by mouth daily before breakfast.    Dispense:  90 tablet    Refill:  1     Follow up plan: Return in about 6 months (around 06/04/2024) for 6 month Annual Physical + POC A1c Thurs 12/11 preferred, early AM, she will need fasting labs after.  Domingo Friend, DO [facility] 12/04/2023, 8:15 AM

## 2023-12-04 NOTE — Patient Instructions (Addendum)
 Thank you for coming to the office today.  Recent Labs    12/26/22 0900 05/23/23 0805 12/04/23 0820  HGBA1C 7.0* 6.7* 7.4*   Keep up the good work  Urine test today  Future Rybelsus  25mg  + anticipated early 2026  You have been referred for a Coronary Calcium Score Cardiac CT Scan. This is a screening test for patients aged 71-50+ with cardiovascular risk factors or who are healthy but would be interested in Cardiovascular Screening for heart disease. Even if there is a family history of heart disease, this imaging can be useful. Typically it can be done every 5+ years or at a different timeline we agree on  The scan will look at the chest and mainly focus on the heart and identify early signs of calcium build up or blockages within the heart arteries. It is not 100% accurate for identifying blockages or heart disease, but it is useful to help us  predict who may have some early changes or be at risk in the future for a heart attack or cardiovascular problem.  The results are reviewed by a Cardiologist and they will document the results. It should become available on MyChart. Typically the results are divided into percentiles based on other patients of the same demographic and age. So it will compare your risk to others similar to you. If you have a higher score >99 or higher percentile >75%tile, it is recommended to consider Statin cholesterol therapy and or referral to Cardiologist. I will try to help explain your results and if we have questions we can contact the Cardiologist.  You will be contacted for scheduling. Usually it is done at any imaging facility through [facility], [facility] or [facility].  The cost is $99 flat fee total and it does not go through insurance, so no authorization is required.    DUE for FASTING BLOOD WORK (no food or drink after midnight before the lab appointment, only water or coffee without cream/sugar on the morning  of)  SCHEDULE "Lab Only" visit in the morning at the clinic for lab draw in 6 MONTHS   - Make sure Lab Only appointment is at about 1 week before your next appointment, so that results will be available  For Lab Results, once available within 2-3 days of blood draw, you can can log in to MyChart online to view your results and a brief explanation. Also, we can discuss results at next follow-up visit.   Please schedule a Follow-up Appointment to: Return in about 6 months (around 06/04/2024) for 6 month Annual Physical + POC A1c Thurs 12/11 preferred, early AM, she will need fasting labs after.  If you have any other questions or concerns, please feel free to call the office or send a message through MyChart. You may also schedule an earlier appointment if necessary.  Additionally, you may be receiving a survey about your experience at our office within a few days to 1 week by e-mail or mail. We value your feedback.  Domingo Friend, DO [facility], [place]

## 2023-12-05 ENCOUNTER — Ambulatory Visit: Payer: Self-pay | Admitting: Family Medicine

## 2023-12-05 LAB — MICROALBUMIN / CREATININE URINE RATIO
Creatinine, Urine: 87 mg/dL (ref 20–275)
Microalb Creat Ratio: 6 mg/g{creat} (ref <30)
Microalb, Ur: 0.5 mg/dL

## 2024-01-29 ENCOUNTER — Telehealth: Payer: Self-pay

## 2024-01-29 NOTE — Telephone Encounter (Signed)
 Copied from CRM [id number]. Topic: General - Other >> Jan 29, 2024 12:20 PM [name] wrote: Reason for CRM: Tina Carey [phone] [facility] patient health care plan Patient maybe diabetic and is not taking a statin and current guideline are recommend patients  69-to 71 years old with diabetes to take a statin.  To decrees cardiovascular risk.  Recommend provider  initiate putting patient on a statin

## 2024-03-05 ENCOUNTER — Telehealth: Payer: Self-pay

## 2024-03-05 DIAGNOSIS — E1169 Type 2 diabetes mellitus with other specified complication: Secondary | ICD-10-CM

## 2024-03-05 DIAGNOSIS — Z23 Encounter for immunization: Secondary | ICD-10-CM

## 2024-03-05 MED ORDER — COMIRNATY 30 MCG/0.3ML IM SUSY
0.3000 mL | PREFILLED_SYRINGE | Freq: Once | INTRAMUSCULAR | 0 refills | Status: AC
Start: 2024-03-05 — End: 2024-03-05

## 2024-03-05 NOTE — Telephone Encounter (Signed)
 [facility], pharmacies currently require a rx for patients to receive the latest COVID vaccine, as it has not been approved by the CDC/ACIP to be given as a routine immunization.   Please advise, thank you!

## 2024-03-05 NOTE — Telephone Encounter (Signed)
 Copied from CRM [id number]. Topic: Clinical - Medication Question >> Mar 05, 2024 11:02 AM Myrick T wrote: Reason for CRM: patient called stated she wants to have the Covid 19/Flu shot on the same day and [facility] [address] [facility] [place] [phone] needs a call with the consent of the provider that its ok to do so. Patient request a call back at [phone] once that has been done so she will know when to go back to the pharmacy

## 2024-03-05 NOTE — Telephone Encounter (Signed)
 Please call patient and let her know that [facility] and confirmed that they do need an official order now for the COVID vaccine. I have signed the order and sent it to them.  She should next [facility] to confirm that they have my order on file for her COVID vaccine. If they had any issues receiving it, I can always call back and give them a verbal order, but they asked me to send the order in the system first.  She can ask for a Flu Vaccine at the same time when she arranges to get the COVID vaccine. They do not need any doctors orders for a Flu Vaccine.  Marsa Officer, DO [facility] 03/05/2024, 5:04 PM

## 2024-03-06 NOTE — Telephone Encounter (Signed)
 Patient notified prescription sent for COVID vaccine. She plans to get that and flu vaccine at the same time

## 2024-03-06 NOTE — Telephone Encounter (Signed)
 Tried calling patient no answer or VM    Ok to advise if she calls back

## 2024-03-20 ENCOUNTER — Other Ambulatory Visit: Payer: Self-pay | Admitting: Family Medicine

## 2024-03-20 DIAGNOSIS — E1169 Type 2 diabetes mellitus with other specified complication: Secondary | ICD-10-CM

## 2024-03-20 NOTE — Telephone Encounter (Signed)
 Requested medication (s) are due for refill today: Yes  Requested medication (s) are on the active medication list: Yes  Last refill:  06/03/23  Future visit scheduled: Yes  Notes to clinic:  Unable to refill due to no refill protocol for this medication.      Requested Prescriptions  Pending Prescriptions Disp Refills   RYBELSUS  14 MG TABS [Pharmacy Med Name: RYBELSUS  TAB 14MG ] 90 tablet 3    Sig: TAKE 1 TABLET DAILY BEFORE BREAKFAST     Off-Protocol Failed - 03/20/2024  4:36 PM      Failed - Medication not assigned to a protocol, review manually.      Passed - Valid encounter within last 12 months    Recent Outpatient Visits           3 months ago Type 2 diabetes mellitus with other specified complication, without long-term current use of insulin [facility])   [place] [facility] [place], [place], [place]

## 2024-04-27 ENCOUNTER — Other Ambulatory Visit: Payer: Self-pay | Admitting: Family Medicine

## 2024-04-27 DIAGNOSIS — K219 Gastro-esophageal reflux disease without esophagitis: Secondary | ICD-10-CM

## 2024-04-28 NOTE — Telephone Encounter (Signed)
 Requested Prescriptions  Pending Prescriptions Disp Refills   pantoprazole  (PROTONIX ) 40 MG tablet [Pharmacy Med Name: PANTOPRAZOLE  TAB 40MG ] 90 tablet 0    Sig: TAKE 1 TABLET DAILY BEFORE BREAKFAST     Gastroenterology: Proton Pump Inhibitors Passed - 04/28/2024  4:21 PM      Passed - Valid encounter within last 12 months    Recent Outpatient Visits           4 months ago Type 2 diabetes mellitus with other specified complication, without long-term current use of insulin [facility])   [place] [facility] [place], [place], [place]

## 2024-05-19 LAB — OPHTHALMOLOGY REPORT-SCANNED

## 2024-06-11 ENCOUNTER — Encounter: Payer: Self-pay | Admitting: Family Medicine

## 2024-06-11 ENCOUNTER — Ambulatory Visit: Admitting: Family Medicine

## 2024-06-11 VITALS — BP 130/70 | HR 78 | Ht 59.0 in | Wt 234.2 lb

## 2024-06-11 DIAGNOSIS — M170 Bilateral primary osteoarthritis of knee: Secondary | ICD-10-CM

## 2024-06-11 DIAGNOSIS — E785 Hyperlipidemia, unspecified: Secondary | ICD-10-CM

## 2024-06-11 DIAGNOSIS — Z0000 Encounter for general adult medical examination without abnormal findings: Secondary | ICD-10-CM

## 2024-06-11 DIAGNOSIS — E113293 Type 2 diabetes mellitus with mild nonproliferative diabetic retinopathy without macular edema, bilateral: Secondary | ICD-10-CM | POA: Diagnosis not present

## 2024-06-11 DIAGNOSIS — Z6841 Body Mass Index (BMI) 40.0 and over, adult: Secondary | ICD-10-CM

## 2024-06-11 DIAGNOSIS — E1141 Type 2 diabetes mellitus with diabetic mononeuropathy: Secondary | ICD-10-CM | POA: Diagnosis not present

## 2024-06-11 DIAGNOSIS — Z7984 Long term (current) use of oral hypoglycemic drugs: Secondary | ICD-10-CM

## 2024-06-11 DIAGNOSIS — E1169 Type 2 diabetes mellitus with other specified complication: Secondary | ICD-10-CM

## 2024-06-11 DIAGNOSIS — E113299 Type 2 diabetes mellitus with mild nonproliferative diabetic retinopathy without macular edema, unspecified eye: Secondary | ICD-10-CM

## 2024-06-11 LAB — POCT GLYCOSYLATED HEMOGLOBIN (HGB A1C): Hemoglobin A1C: 7.7 % — AB (ref 4.0–5.6)

## 2024-06-11 NOTE — Progress Notes (Signed)
 Subjective:    Patient ID: Tina Carey, female    DOB: 1951-02-28, 73 y.o.   MRN: [id number]  Tina Carey is a 73 y.o. female presenting on 06/11/2024 for Annual Exam   HPI  Discussed the use of AI scribe software for clinical note transcription with the patient, who gave verbal consent to proceed.  History of Present Illness   Tina Carey is a 73 year old female with diabetes who presents for an annual physical exam.  Use of herbal supplements - Consuming organic ginger honey tea daily for the past three months. - Feels increased energy after drinking the tea in the morning with one cup of coffee.   CHRONIC DM, Type 2 with complications - Neuropathy, Retinopathy, Hyperlipidemia Morbid Obesity BMI >47 - Recent hemoglobin A1c increased from 7.4 to 7.7. - Adheres to dietary modifications including reduced carbohydrate intake and smaller portion sizes. - Engages in regular physical activity, including walking and using a stepper at home. - Concerned about the recent increase in A1c despite lifestyle efforts. Meds: Metformin  XR x 2 = 500mg  1000mg  dose in pm with supper, Rybelsus  14mg  daily Reports good compliance. Tolerating well w/o side-effects fam history brother/sister, paternal grandmother with diabetes Followed by [facility] for management of NPDR Denies hypoglycemia, polyuria, visual changes.   HYPERLIPIDEMIA: - Reports no concerns. Last lipid panel 05/2023, controlled 107 Not on medication. She declines Statin therapy Pending lab today   CHRONIC HTN: Reports - Eliminated salt from diet and uses sea salt sparingly. Current Meds - Lisinopril  10mg  dailiy   Reports good compliance, took meds today. Tolerating well, w/o complaints. Denies CP, dyspnea, HA, edema, dizziness / lightheadedness  GERD On PPI, controlled      Health Maintenance:   Mammogram completed, negative 10/2023 Colonoscopy 10/16/22 - 3 polyp, repeat 5 years. Repeat 2029   Up to date  on all vaccines Flu  PNEUMONIA COVID   Due for Shingrix but she declines      06/11/2024    8:39 AM 12/04/2023    8:13 AM 05/29/2023    8:16 AM  Depression screen PHQ 2/9  Decreased Interest 0 0 0  Down, Depressed, Hopeless 0 0 0  PHQ - 2 Score 0 0 0  Altered sleeping  0   Tired, decreased energy  0   Change in appetite  0   Feeling bad or failure about yourself   0   Trouble concentrating  0   Moving slowly or fidgety/restless  0   Suicidal thoughts  0   PHQ-9 Score  0       Data saved with a previous flowsheet row definition       06/11/2024    8:39 AM 12/04/2023    8:14 AM 05/29/2023    8:16 AM 12/26/2022    8:47 AM  GAD 7 : Generalized Anxiety Score  Nervous, Anxious, on Edge 0 0 0 0  Control/stop worrying 0 0 0 0  Worry too much - different things 0 0 0 0  Trouble relaxing 0 0 0 0  Restless 0 0 0 0  Easily annoyed or irritable 0 0 0 0  Afraid - awful might happen 0 0 0 0  Total GAD 7 Score 0 0 0 0  Anxiety Difficulty    Not difficult at all     Past Medical History:  Diagnosis Date   Arthritis    knees   Cancer (HCC) 2009   sinus  GERD (gastroesophageal reflux disease)    Osteoporosis    Past Surgical History:  Procedure Laterality Date   CATARACT EXTRACTION W/PHACO Left 06/17/2017   Procedure: CATARACT EXTRACTION PHACO AND INTRAOCULAR LENS PLACEMENT (IOC)  LEFT DIABETIC;  Surgeon: Mittie Gaskin, MD;  Location: [facility];  Service: Ophthalmology;  Laterality: Left;  Diabetic - oral meds Does NOT want to be first   CATARACT EXTRACTION W/PHACO Right 10/23/2017   Procedure: CATARACT EXTRACTION PHACO AND INTRAOCULAR LENS PLACEMENT (IOC) RIGHT DIABETIC;  Surgeon: Mittie Gaskin, MD;  Location: [facility];  Service: Ophthalmology;  Laterality: Right;  diabetic -oral meds   GASTRIC BYPASS     NASAL SINUS SURGERY  2009   Social History   Socioeconomic History   Marital status: Married    Spouse name: Not on file   Number of  children: Not on file   Years of education: Not on file   Highest education level: Not on file  Occupational History   Not on file  Tobacco Use   Smoking status: Never   Smokeless tobacco: Never  Vaping Use   Vaping status: Never Used  Substance and Sexual Activity   Alcohol use: No   Drug use: Never   Sexual activity: Not on file  Other Topics Concern   Not on file  Social History Narrative   Not on file   Social Drivers of Health   Tobacco Use: Low Risk (06/11/2024)   Patient History    Smoking Tobacco Use: Never    Smokeless Tobacco Use: Never    Passive Exposure: Not on file  Financial Resource Strain: Low Risk (05/29/2023)   Overall Financial Resource Strain (CARDIA)    Difficulty of Paying Living Expenses: Not hard at all  Food Insecurity: No Food Insecurity (05/29/2023)   Hunger Vital Sign    Worried About Running Out of Food in the Last Year: Never true    Ran Out of Food in the Last Year: Never true  Transportation Needs: No Transportation Needs (05/29/2023)   PRAPARE - Administrator, Civil Service (Medical): No    Lack of Transportation (Non-Medical): No  Physical Activity: Sufficiently Active (05/29/2023)   Exercise Vital Sign    Days of Exercise per Week: 5 days    Minutes of Exercise per Session: 150+ min  Stress: No Stress Concern Present (05/29/2023)   [employer] of Occupational Health - Occupational Stress Questionnaire    Feeling of Stress : Not at all  Social Connections: Moderately Isolated (05/29/2023)   Social Connection and Isolation Panel    Frequency of Communication with Friends and Family: More than three times a week    Frequency of Social Gatherings with Friends and Family: Twice a week    Attends Religious Services: More than 4 times per year    Active Member of [employer] or Organizations: No    Attends Banker Meetings: Never    Marital Status: Widowed  Intimate Partner Violence: Not At Risk (05/29/2023)    Humiliation, Afraid, Rape, and Kick questionnaire    Fear of Current or Ex-Partner: No    Emotionally Abused: No    Physically Abused: No    Sexually Abused: No  Depression (PHQ2-9): Low Risk (06/11/2024)   Depression (PHQ2-9)    PHQ-2 Score: 0  Alcohol Screen: Low Risk (12/26/2022)   Alcohol Screen    Last Alcohol Screening Score (AUDIT): 0  Housing: Low Risk (05/29/2023)   Housing    Last Housing Risk  Score: 0  Utilities: Not At Risk (05/29/2023)   AHC Utilities    Threatened with loss of utilities: No  Health Literacy: Adequate Health Literacy (05/29/2023)   B1300 Health Literacy    Frequency of need for help with medical instructions: Never   Family History  Problem Relation Age of Onset   Stroke Mother    Diabetes Mother    Diabetes Maternal Grandmother    Diabetes Paternal Grandmother    Diabetes Brother    Breast cancer Neg Hx    Current Outpatient Medications on File Prior to Visit  Medication Sig   calcium carbonate (OS-CAL) 1250 (500 Ca) MG chewable tablet Chew 1 tablet by mouth daily.   Cholecalciferol (VITAMIN D3 PO) Take 200 Units by mouth daily.   Cinnamon 500 MG TABS Take by mouth in the morning and at bedtime.   Coenzyme Q-10 200 MG CAPS Take 200 mg by mouth daily.   cyanocobalamin 100 MCG tablet Take by mouth.   lisinopril  (ZESTRIL ) 10 MG tablet Take 1 tablet (10 mg total) by mouth daily.   magnesium gluconate (MAGONATE) 500 MG tablet Take 500 mg by mouth 2 (two) times daily.   metFORMIN  (GLUCOPHAGE -XR) 500 MG 24 hr tablet Take 2 tablets (1,000 mg total) by mouth daily with supper.   Multiple Vitamin (MULTIVITAMIN) capsule Take 1 capsule by mouth daily.   pantoprazole  (PROTONIX ) 40 MG tablet TAKE 1 TABLET DAILY BEFORE BREAKFAST   pyridOXINE (VITAMIN B-6) 50 MG tablet Take 50 mg by mouth daily.   RYBELSUS  14 MG TABS TAKE 1 TABLET DAILY BEFORE BREAKFAST   TURMERIC CURCUMIN PO Take by mouth.   Turmeric, Curcuma Longa, (CURCUMIN) POWD Take 100 mg by mouth 2  times daily at 12 noon and 4 pm.   No current facility-administered medications on file prior to visit.    Review of Systems  Constitutional:  Negative for activity change, appetite change, chills, diaphoresis, fatigue and fever.  HENT:  Negative for congestion and hearing loss.   Eyes:  Negative for visual disturbance.  Respiratory:  Negative for cough, chest tightness, shortness of breath and wheezing.   Cardiovascular:  Negative for chest pain, palpitations and leg swelling.  Gastrointestinal:  Negative for abdominal pain, constipation, diarrhea, nausea and vomiting.  Genitourinary:  Negative for dysuria, frequency and hematuria.  Musculoskeletal:  Negative for arthralgias and neck pain.  Skin:  Negative for rash.  Neurological:  Negative for dizziness, weakness, light-headedness, numbness and headaches.  Hematological:  Negative for adenopathy.  Psychiatric/Behavioral:  Negative for behavioral problems, dysphoric mood and sleep disturbance.    Per HPI unless specifically indicated above     Objective:    BP 130/70 (BP Location: Left Arm, Patient Position: Sitting, Cuff Size: Large)   Pulse 78   Ht 4' 11 (1.499 m)   Wt 234 lb 4 oz (106.3 kg)   SpO2 96%   BMI 47.31 kg/m   Wt Readings from Last 3 Encounters:  06/11/24 234 lb 4 oz (106.3 kg)  12/04/23 237 lb 4 oz (107.6 kg)  05/29/23 227 lb 3.2 oz (103.1 kg)    Physical Exam Vitals and nursing note reviewed.  Constitutional:      General: She is not in acute distress.    Appearance: She is well-developed. She is obese. She is not diaphoretic.     Comments: Well-appearing, comfortable, cooperative  HENT:     Head: Normocephalic and atraumatic.  Eyes:     General:  Right eye: No discharge.        Left eye: No discharge.     Conjunctiva/sclera: Conjunctivae normal.     Pupils: Pupils are equal, round, and reactive to light.  Neck:     Thyroid: No thyromegaly.  Cardiovascular:     Rate and Rhythm: Normal rate  and regular rhythm.     Pulses: Normal pulses.     Heart sounds: Normal heart sounds. No murmur heard. Pulmonary:     Effort: Pulmonary effort is normal. No respiratory distress.     Breath sounds: Normal breath sounds. No wheezing or rales.  Abdominal:     General: Bowel sounds are normal. There is no distension.     Palpations: Abdomen is soft. There is no mass.     Tenderness: There is no abdominal tenderness.  Musculoskeletal:        General: No tenderness. Normal range of motion.     Cervical back: Normal range of motion and neck supple.     Comments: Upper / Lower Extremities: - Normal muscle tone, strength bilateral upper extremities 5/5, lower extremities 5/5  Lymphadenopathy:     Cervical: No cervical adenopathy.  Skin:    General: Skin is warm and dry.     Findings: No erythema or rash.  Neurological:     Mental Status: She is alert and oriented to person, place, and time.     Comments: Distal sensation intact to light touch all extremities  Psychiatric:        Mood and Affect: Mood normal.        Behavior: Behavior normal.        Thought Content: Thought content normal.     Comments: Well groomed, good eye contact, normal speech and thoughts     Results for orders placed or performed in visit on 06/11/24  POCT HgB A1C   Collection Time: 06/11/24  8:45 AM  Result Value Ref Range   Hemoglobin A1C 7.7 (A) 4.0 - 5.6 %   HbA1c POC (<> result, manual entry)     HbA1c, POC (prediabetic range)     HbA1c, POC (controlled diabetic range)        Assessment & Plan:   Problem List Items Addressed This Visit     Hyperlipidemia associated with type 2 diabetes mellitus (HCC)   Relevant Orders   Lipid panel   CBC with Differential/Platelet   TSH   Comprehensive metabolic panel with GFR   CT CARDIAC SCORING (SELF PAY ONLY)   Morbid obesity with BMI of 45.0-49.9, adult (HCC)   Relevant Orders   Lipid panel   CBC with Differential/Platelet   Comprehensive metabolic  panel with GFR   NPDR (nonproliferative diabetic retinopathy) (HCC)   Osteoarthritis of knees, bilateral   Type 2 diabetes mellitus with other specified complication (HCC)   Other Visit Diagnoses       Annual physical exam    -  Primary   Relevant Orders   Lipid panel   CBC with Differential/Platelet   Microalbumin / creatinine urine ratio   TSH   Comprehensive metabolic panel with GFR     Long term current use of oral hypoglycemic drug         Diabetic mononeuropathy associated with type 2 diabetes mellitus (HCC)            Updated Health Maintenance information Fasting Lab pending today Encouraged improvement to lifestyle with diet and exercise Goal of weight loss   Adult Wellness Visit Annual  wellness visit completed. Vaccinations current. Blood pressure controlled. Weight decreased to 234 lbs. Eye exam showed no diabetic retinopathy. Mammogram and colonoscopy up to date. - Ordered blood work including cholesterol, blood counts, kidney, liver, chemistry, thyroid, and urine tests for kidney function. - Scheduled CT heart scan for January 2026.  Type 2 diabetes mellitus with complication mild nonproliferative diabetic retinopathy and Hyperlipidemia, Diabetic Neuropathy  A1c increased to 7.7. Current medications: metformin  and Rybelsus . Discussed potential future use of Jardiance or Farxiga. She prefers to avoid injections. - Continue metformin  and Rybelsus . 14mg  daily - Monitor A1c and adjust treatment as needed. - Consider Jardiance or Farxiga if glycemic control does not improve. - Scheduled follow-up in six months.  Hyperlipidemia Due for lipid panel today She declines Statin therapy at this time Interested in CT Coronary scan. Order in today  Morbid obesity BMI >47 Weight decreased to 234 lbs. Engaging in regular physical activity and low-carbohydrate diet. - Continue current exercise regimen and dietary modifications. - Monitor weight and adjust lifestyle  interventions as needed.  - On oral GLP1 for DM - Improving exercise      Orders Placed This Encounter  Procedures   CT CARDIAC SCORING (SELF PAY ONLY)    Standing Status:   Future    Expected Date:   07/12/2024    Expiration Date:   06/11/2025    Scheduling Instructions:     She requests scheduling for January 2026    Preferred imaging location?:   [facility]   Lipid panel    Has the patient fasted?:   Yes   CBC with Differential/Platelet   Microalbumin / creatinine urine ratio   TSH   Comprehensive metabolic panel with GFR    Has the patient fasted?:   Yes   POCT HgB A1C    No orders of the defined types were placed in this encounter.    Follow up plan: Return in about 6 months (around 12/10/2024) for 6 month DM A1c.  Marsa Officer, DO [facility] 06/11/2024, 8:55 AM

## 2024-06-11 NOTE — Patient Instructions (Addendum)
 Thank you for coming to the office today.  Recent Labs    12/04/23 0820 06/11/24 0845  HGBA1C 7.4* 7.7*   Consider med changes in 6 months  New oral medication - Jardiance or Farxiga, to help reduce blood sugar  Keep up the great work and effort.  Consider future changes to Rybelsus  if they become available.   You have been referred for a Coronary Calcium Score Cardiac CT Scan. This is a screening test for patients aged 71-50+ with cardiovascular risk factors or who are healthy but would be interested in Cardiovascular Screening for heart disease. Even if there is a family history of heart disease, this imaging can be useful. Typically it can be done every 5+ years or at a different timeline we agree on  The scan will look at the chest and mainly focus on the heart and identify early signs of calcium build up or blockages within the heart arteries. It is not 100% accurate for identifying blockages or heart disease, but it is useful to help us  predict who may have some early changes or be at risk in the future for a heart attack or cardiovascular problem.  The results are reviewed by a Cardiologist and they will document the results. It should become available on MyChart. Typically the results are divided into percentiles based on other patients of the same demographic and age. So it will compare your risk to others similar to you. If you have a higher score >99 or higher percentile >75%tile, it is recommended to consider Statin cholesterol therapy and or referral to Cardiologist. I will try to help explain your results and if we have questions we can contact the Cardiologist.  You will be contacted for scheduling. Usually it is done at any imaging facility through [facility], [facility] or [facility].  The cost is $99 flat fee total and it does not go through insurance, so no authorization is required.   Please schedule a Follow-up Appointment to: Return  in about 6 months (around 12/10/2024) for 6 month DM A1c.  If you have any other questions or concerns, please feel free to call the office or send a message through MyChart. You may also schedule an earlier appointment if necessary.  Additionally, you may be receiving a survey about your experience at our office within a few days to 1 week by e-mail or mail. We value your feedback.  Marsa Officer, DO [facility], [place]

## 2024-06-12 LAB — LIPID PANEL
Cholesterol: 208 mg/dL — ABNORMAL HIGH (ref <200)
HDL: 69 mg/dL (ref >=50)
LDL Cholesterol (Calc): 118 mg/dL — ABNORMAL HIGH
Non-HDL Cholesterol (Calc): 139 mg/dL — ABNORMAL HIGH (ref <130)
Total CHOL/HDL Ratio: 3 (calc) (ref <5.0)
Triglycerides: 104 mg/dL (ref <150)

## 2024-06-12 LAB — CBC WITH DIFFERENTIAL/PLATELET
Absolute Lymphocytes: 2763 {cells}/uL (ref 850–3900)
Absolute Monocytes: 510 {cells}/uL (ref 200–950)
Basophils Absolute: 70 {cells}/uL (ref 0–200)
Basophils Relative: 0.8 %
Eosinophils Absolute: 211 {cells}/uL (ref 15–500)
Eosinophils Relative: 2.4 %
HCT: 42.3 % (ref 35.9–46.0)
Hemoglobin: 13.3 g/dL (ref 11.7–15.5)
MCH: 29.1 pg (ref 27.0–33.0)
MCHC: 31.4 g/dL — ABNORMAL LOW (ref 31.6–35.4)
MCV: 92.6 fL (ref 81.4–101.7)
MPV: 9.7 fL (ref 7.5–12.5)
Monocytes Relative: 5.8 %
Neutro Abs: 5245 {cells}/uL (ref 1500–7800)
Neutrophils Relative %: 59.6 %
Platelets: 393 Thousand/uL (ref 140–400)
RBC: 4.57 Million/uL (ref 3.80–5.10)
RDW: 12.6 % (ref 11.0–15.0)
Total Lymphocyte: 31.4 %
WBC: 8.8 Thousand/uL (ref 3.8–10.8)

## 2024-06-12 LAB — COMPREHENSIVE METABOLIC PANEL WITH GFR
AG Ratio: 1.5 (calc) (ref 1.0–2.5)
ALT: 14 U/L (ref 6–29)
AST: 16 U/L (ref 10–35)
Albumin: 4.5 g/dL (ref 3.6–5.1)
Alkaline phosphatase (APISO): 76 U/L (ref 37–153)
BUN/Creatinine Ratio: 33 (calc) — ABNORMAL HIGH (ref 6–22)
BUN: 27 mg/dL — ABNORMAL HIGH (ref 7–25)
CO2: 29 mmol/L (ref 20–32)
Calcium: 10.4 mg/dL (ref 8.6–10.4)
Chloride: 100 mmol/L (ref 98–110)
Creat: 0.81 mg/dL (ref 0.60–1.00)
Globulin: 3.1 g/dL (ref 1.9–3.7)
Glucose, Bld: 170 mg/dL — ABNORMAL HIGH (ref 65–99)
Potassium: 4.5 mmol/L (ref 3.5–5.3)
Sodium: 138 mmol/L (ref 135–146)
Total Bilirubin: 0.4 mg/dL (ref 0.2–1.2)
Total Protein: 7.6 g/dL (ref 6.1–8.1)
eGFR: 78 mL/min/1.73m2 (ref >=60)

## 2024-06-12 LAB — TSH: TSH: 0.84 m[IU]/L (ref 0.40–4.50)

## 2024-06-12 LAB — MICROALBUMIN / CREATININE URINE RATIO
Creatinine, Urine: 56 mg/dL (ref 20–275)
Microalb Creat Ratio: 7 mg/g{creat} (ref <30)
Microalb, Ur: 0.4 mg/dL

## 2024-06-19 ENCOUNTER — Ambulatory Visit: Payer: Self-pay | Admitting: Family Medicine

## 2024-07-09 ENCOUNTER — Other Ambulatory Visit: Payer: Self-pay | Admitting: Family Medicine

## 2024-07-09 DIAGNOSIS — K219 Gastro-esophageal reflux disease without esophagitis: Secondary | ICD-10-CM

## 2024-07-10 NOTE — Telephone Encounter (Signed)
 Requested Prescriptions  Pending Prescriptions Disp Refills   pantoprazole  (PROTONIX ) 40 MG tablet [Pharmacy Med Name: PANTOPRAZOLE  TAB 40MG ] 90 tablet 0    Sig: TAKE 1 TABLET DAILY BEFORE BREAKFAST     Gastroenterology: Proton Pump Inhibitors Passed - 07/10/2024  1:19 PM      Passed - Valid encounter within last 12 months    Recent Outpatient Visits           4 weeks ago Annual physical exam   [facility] [place], Marsa PARAS, DO   7 months ago Type 2 diabetes mellitus with other specified complication, without long-term current use of insulin [facility])   [place] [facility] [place], Marsa PARAS, [place]

## 2024-07-28 ENCOUNTER — Other Ambulatory Visit (HOSPITAL_COMMUNITY): Payer: Self-pay

## 2024-08-06 ENCOUNTER — Other Ambulatory Visit

## 2024-08-20 ENCOUNTER — Other Ambulatory Visit

## 2024-12-10 ENCOUNTER — Ambulatory Visit: Admitting: Family Medicine
